# Patient Record
Sex: Male | Born: 1935 | Race: White | Hispanic: No | Marital: Married
Health system: Southern US, Community
[De-identification: ages and names within clinical notes are randomized; demographics above are authoritative.]

---

## 2004-08-03 ENCOUNTER — Ambulatory Visit: Payer: Self-pay | Admitting: Urology

## 2004-08-03 ENCOUNTER — Other Ambulatory Visit: Payer: Self-pay

## 2004-08-06 ENCOUNTER — Ambulatory Visit: Payer: Self-pay | Admitting: Urology

## 2006-10-01 ENCOUNTER — Ambulatory Visit: Payer: Self-pay | Admitting: Rheumatology

## 2008-01-19 ENCOUNTER — Ambulatory Visit: Payer: Self-pay | Admitting: Rheumatology

## 2008-02-02 ENCOUNTER — Ambulatory Visit: Payer: Self-pay | Admitting: Rheumatology

## 2008-03-30 ENCOUNTER — Other Ambulatory Visit: Payer: Self-pay

## 2008-03-30 ENCOUNTER — Ambulatory Visit: Payer: Self-pay | Admitting: Unknown Physician Specialty

## 2008-04-06 ENCOUNTER — Ambulatory Visit: Payer: Self-pay | Admitting: Unknown Physician Specialty

## 2008-08-24 ENCOUNTER — Ambulatory Visit: Payer: Self-pay | Admitting: Family Medicine

## 2008-09-05 ENCOUNTER — Ambulatory Visit: Payer: Self-pay | Admitting: Ophthalmology

## 2008-11-24 ENCOUNTER — Ambulatory Visit: Payer: Self-pay | Admitting: Urology

## 2008-12-05 ENCOUNTER — Ambulatory Visit: Payer: Self-pay | Admitting: Urology

## 2009-11-20 ENCOUNTER — Ambulatory Visit: Payer: Self-pay | Admitting: Internal Medicine

## 2010-08-18 ENCOUNTER — Emergency Department: Payer: Self-pay | Admitting: Emergency Medicine

## 2011-10-26 ENCOUNTER — Inpatient Hospital Stay: Payer: Self-pay | Admitting: Specialist

## 2011-10-26 LAB — CBC WITH DIFFERENTIAL/PLATELET
Basophil #: 0 10*3/uL (ref 0.0–0.1)
Basophil %: 0 %
HCT: 26.1 % — ABNORMAL LOW (ref 40.0–52.0)
HGB: 9 g/dL — ABNORMAL LOW (ref 13.0–18.0)
Lymphocyte #: 0.9 10*3/uL — ABNORMAL LOW (ref 1.0–3.6)
Lymphocyte %: 6.1 %
MCH: 32.7 pg (ref 26.0–34.0)
MCHC: 34.3 g/dL (ref 32.0–36.0)
MCV: 95 fL (ref 80–100)
Monocyte #: 1.4 x10 3/mm — ABNORMAL HIGH (ref 0.2–1.0)
Neutrophil #: 12.6 10*3/uL — ABNORMAL HIGH (ref 1.4–6.5)
Neutrophil %: 84.4 %
RBC: 2.74 10*6/uL — ABNORMAL LOW (ref 4.40–5.90)
RDW: 11.9 % (ref 11.5–14.5)
WBC: 14.9 10*3/uL — ABNORMAL HIGH (ref 3.8–10.6)

## 2011-10-26 LAB — CBC
HCT: 30.7 % — ABNORMAL LOW (ref 40.0–52.0)
HGB: 10.6 g/dL — ABNORMAL LOW (ref 13.0–18.0)
MCH: 32.5 pg (ref 26.0–34.0)
MCHC: 34.6 g/dL (ref 32.0–36.0)
MCV: 94 fL (ref 80–100)
Platelet: 254 10*3/uL (ref 150–440)
RBC: 3.27 10*6/uL — ABNORMAL LOW (ref 4.40–5.90)
RDW: 11.8 % (ref 11.5–14.5)
WBC: 8.2 10*3/uL (ref 3.8–10.6)

## 2011-10-26 LAB — BASIC METABOLIC PANEL
Anion Gap: 8 (ref 7–16)
Anion Gap: 9 (ref 7–16)
BUN: 30 mg/dL — ABNORMAL HIGH (ref 7–18)
BUN: 31 mg/dL — ABNORMAL HIGH (ref 7–18)
Calcium, Total: 8.4 mg/dL — ABNORMAL LOW (ref 8.5–10.1)
Calcium, Total: 7.7 mg/dL — ABNORMAL LOW (ref 8.5–10.1)
Chloride: 97 mmol/L — ABNORMAL LOW (ref 98–107)
Chloride: 99 mmol/L (ref 98–107)
Co2: 22 mmol/L (ref 21–32)
Creatinine: 1.9 mg/dL — ABNORMAL HIGH (ref 0.60–1.30)
Creatinine: 1.96 mg/dL — ABNORMAL HIGH (ref 0.60–1.30)
EGFR (African American): 37 — ABNORMAL LOW
EGFR (African American): 36 — ABNORMAL LOW
EGFR (Non-African Amer.): 32 — ABNORMAL LOW
EGFR (Non-African Amer.): 31 — ABNORMAL LOW
Glucose: 104 mg/dL — ABNORMAL HIGH (ref 65–99)
Glucose: 166 mg/dL — ABNORMAL HIGH (ref 65–99)
Osmolality: 271 (ref 275–301)
Sodium: 130 mmol/L — ABNORMAL LOW (ref 136–145)

## 2011-10-26 LAB — URINALYSIS, COMPLETE
Bilirubin,UR: NEGATIVE
Blood: NEGATIVE
Ketone: NEGATIVE
Leukocyte Esterase: NEGATIVE
Ph: 6 (ref 4.5–8.0)
RBC,UR: <1 /HPF (ref 0–5)
Squamous Epithelial: 1
WBC UR: <1 /HPF (ref 0–5)

## 2011-10-26 LAB — PROTIME-INR: Prothrombin Time: 13.1 secs (ref 11.5–14.7)

## 2011-10-27 LAB — CBC WITH DIFFERENTIAL/PLATELET
Basophil #: 0 10*3/uL (ref 0.0–0.1)
Basophil %: 0.2 %
Eosinophil #: 0 10*3/uL (ref 0.0–0.7)
Eosinophil %: 0.2 %
HCT: 20.1 % — ABNORMAL LOW (ref 40.0–52.0)
HGB: 7 g/dL — ABNORMAL LOW (ref 13.0–18.0)
Lymphocyte #: 1.3 10*3/uL (ref 1.0–3.6)
Lymphocyte %: 15.4 %
MCHC: 35 g/dL (ref 32.0–36.0)
MCV: 94 fL (ref 80–100)
Monocyte #: 1 x10 3/mm (ref 0.2–1.0)
Monocyte %: 11.8 %
Platelet: 170 10*3/uL (ref 150–440)
RBC: 2.14 10*6/uL — ABNORMAL LOW (ref 4.40–5.90)
RDW: 12.1 % (ref 11.5–14.5)

## 2011-10-27 LAB — BASIC METABOLIC PANEL
BUN: 39 mg/dL — ABNORMAL HIGH (ref 7–18)
Calcium, Total: 7.3 mg/dL — ABNORMAL LOW (ref 8.5–10.1)
Co2: 21 mmol/L (ref 21–32)
Creatinine: 2.16 mg/dL — ABNORMAL HIGH (ref 0.60–1.30)
EGFR (Non-African Amer.): 28 — ABNORMAL LOW
Glucose: 127 mg/dL — ABNORMAL HIGH (ref 65–99)
Osmolality: 277 (ref 275–301)
Sodium: 133 mmol/L — ABNORMAL LOW (ref 136–145)

## 2011-10-28 LAB — BASIC METABOLIC PANEL
BUN: 32 mg/dL — ABNORMAL HIGH (ref 7–18)
Chloride: 103 mmol/L (ref 98–107)
Co2: 21 mmol/L (ref 21–32)
EGFR (African American): 40 — ABNORMAL LOW
EGFR (Non-African Amer.): 35 — ABNORMAL LOW
Osmolality: 271 (ref 275–301)

## 2011-10-28 LAB — HEMOGLOBIN: HGB: 8.7 g/dL — ABNORMAL LOW (ref 13.0–18.0)

## 2011-10-29 ENCOUNTER — Encounter: Payer: Self-pay | Admitting: Internal Medicine

## 2011-10-29 LAB — BASIC METABOLIC PANEL
BUN: 34 mg/dL — ABNORMAL HIGH (ref 7–18)
Co2: 19 mmol/L — ABNORMAL LOW (ref 21–32)
Creatinine: 1.98 mg/dL — ABNORMAL HIGH (ref 0.60–1.30)
EGFR (Non-African Amer.): 31 — ABNORMAL LOW
Glucose: 102 mg/dL — ABNORMAL HIGH (ref 65–99)

## 2011-10-30 ENCOUNTER — Encounter: Payer: Self-pay | Admitting: Internal Medicine

## 2011-11-05 LAB — CBC WITH DIFFERENTIAL/PLATELET
Basophil %: 0.5 %
Eosinophil #: 0.2 10*3/uL (ref 0.0–0.7)
Lymphocyte %: 16.2 %
Monocyte %: 8.5 %
Neutrophil #: 7.8 10*3/uL — ABNORMAL HIGH (ref 1.4–6.5)
Neutrophil %: 72.8 %
Platelet: 416 10*3/uL (ref 150–440)
RBC: 2.56 10*6/uL — ABNORMAL LOW (ref 4.40–5.90)
RDW: 13.1 % (ref 11.5–14.5)
WBC: 10.7 10*3/uL — ABNORMAL HIGH (ref 3.8–10.6)

## 2011-11-05 LAB — URINALYSIS, COMPLETE
Bilirubin,UR: NEGATIVE
Blood: NEGATIVE
Glucose,UR: NEGATIVE mg/dL (ref 0–75)
Leukocyte Esterase: NEGATIVE
Ph: 5 (ref 4.5–8.0)
Squamous Epithelial: <1
WBC UR: 1 /HPF (ref 0–5)

## 2011-11-05 LAB — RENAL FUNCTION PANEL
BUN: 54 mg/dL — ABNORMAL HIGH (ref 7–18)
Calcium, Total: 7.9 mg/dL — ABNORMAL LOW (ref 8.5–10.1)
Chloride: 102 mmol/L (ref 98–107)
Co2: 22 mmol/L (ref 21–32)
Creatinine: 2.3 mg/dL — ABNORMAL HIGH (ref 0.60–1.30)
EGFR (African American): 30 — ABNORMAL LOW
EGFR (Non-African Amer.): 25 — ABNORMAL LOW
Glucose: 97 mg/dL (ref 65–99)
Phosphorus: 4.7 mg/dL (ref 2.5–4.9)
Potassium: 4.8 mmol/L (ref 3.5–5.1)

## 2011-11-14 ENCOUNTER — Ambulatory Visit: Payer: Self-pay | Admitting: Internal Medicine

## 2011-11-14 LAB — CBC WITH DIFFERENTIAL/PLATELET
Basophil %: 0.6 %
Eosinophil #: 0.1 10*3/uL (ref 0.0–0.7)
Eosinophil %: 1.7 %
HGB: 8.8 g/dL — ABNORMAL LOW (ref 13.0–18.0)
MCH: 31.9 pg (ref 26.0–34.0)
MCV: 94 fL (ref 80–100)
Monocyte #: 0.8 x10 3/mm (ref 0.2–1.0)
Monocyte %: 11.2 %
Neutrophil #: 4.6 10*3/uL (ref 1.4–6.5)
Neutrophil %: 66.3 %
Platelet: 448 10*3/uL — ABNORMAL HIGH (ref 150–440)
RDW: 14.2 % (ref 11.5–14.5)

## 2011-11-14 LAB — RENAL FUNCTION PANEL
Anion Gap: 11 (ref 7–16)
BUN: 39 mg/dL — ABNORMAL HIGH (ref 7–18)
Calcium, Total: 8 mg/dL — ABNORMAL LOW (ref 8.5–10.1)
Co2: 22 mmol/L (ref 21–32)
Creatinine: 1.94 mg/dL — ABNORMAL HIGH (ref 0.60–1.30)
EGFR (African American): 36 — ABNORMAL LOW
Glucose: 90 mg/dL (ref 65–99)
Phosphorus: 3.6 mg/dL (ref 2.5–4.9)
Potassium: 4.1 mmol/L (ref 3.5–5.1)
Sodium: 137 mmol/L (ref 136–145)

## 2011-11-28 LAB — RENAL FUNCTION PANEL
Albumin: 3.1 g/dL — ABNORMAL LOW (ref 3.4–5.0)
Anion Gap: 10 (ref 7–16)
BUN: 45 mg/dL — ABNORMAL HIGH (ref 7–18)
Chloride: 102 mmol/L (ref 98–107)
Co2: 23 mmol/L (ref 21–32)
EGFR (Non-African Amer.): 27 — ABNORMAL LOW
Glucose: 83 mg/dL (ref 65–99)
Sodium: 135 mmol/L — ABNORMAL LOW (ref 136–145)

## 2011-11-30 ENCOUNTER — Encounter: Payer: Self-pay | Admitting: Internal Medicine

## 2012-08-06 ENCOUNTER — Ambulatory Visit: Payer: Self-pay | Admitting: Family Medicine

## 2012-08-24 ENCOUNTER — Ambulatory Visit: Payer: Self-pay | Admitting: Nephrology

## 2012-08-24 LAB — CBC WITH DIFFERENTIAL/PLATELET
Basophil #: 0 10*3/uL (ref 0.0–0.1)
Basophil %: 0.5 %
Eosinophil #: 0.2 10*3/uL (ref 0.0–0.7)
HCT: 31.6 % — ABNORMAL LOW (ref 40.0–52.0)
HGB: 10.6 g/dL — ABNORMAL LOW (ref 13.0–18.0)
Lymphocyte #: 1.5 10*3/uL (ref 1.0–3.6)
MCH: 31.8 pg (ref 26.0–34.0)
MCHC: 33.5 g/dL (ref 32.0–36.0)
MCV: 95 fL (ref 80–100)
Monocyte #: 0.7 x10 3/mm (ref 0.2–1.0)
Neutrophil #: 4.1 10*3/uL (ref 1.4–6.5)
Platelet: 270 10*3/uL (ref 150–440)
RBC: 3.33 10*6/uL — ABNORMAL LOW (ref 4.40–5.90)
RDW: 12.3 % (ref 11.5–14.5)
WBC: 6.6 10*3/uL (ref 3.8–10.6)

## 2012-08-24 LAB — URINALYSIS, COMPLETE
Bacteria: NONE SEEN
Blood: NEGATIVE
Hyaline Cast: 1
Nitrite: NEGATIVE
Ph: 6 (ref 4.5–8.0)
Protein: >=500
RBC,UR: 1 /HPF (ref 0–5)
Specific Gravity: 1.015 (ref 1.003–1.030)
Squamous Epithelial: 2
WBC UR: 4 /HPF (ref 0–5)

## 2012-08-24 LAB — COMPREHENSIVE METABOLIC PANEL
Alkaline Phosphatase: 118 U/L (ref 50–136)
BUN: 34 mg/dL — ABNORMAL HIGH (ref 7–18)
Bilirubin,Total: 0.3 mg/dL (ref 0.2–1.0)
Chloride: 99 mmol/L (ref 98–107)
EGFR (African American): 31 — ABNORMAL LOW
EGFR (Non-African Amer.): 27 — ABNORMAL LOW
Osmolality: 272 (ref 275–301)
Potassium: 4.5 mmol/L (ref 3.5–5.1)
SGOT(AST): 30 U/L (ref 15–37)
Sodium: 132 mmol/L — ABNORMAL LOW (ref 136–145)
Total Protein: 6.8 g/dL (ref 6.4–8.2)

## 2012-08-26 ENCOUNTER — Observation Stay: Payer: Self-pay | Admitting: Nephrology

## 2012-11-05 ENCOUNTER — Ambulatory Visit: Payer: Self-pay | Admitting: Surgery

## 2012-11-12 ENCOUNTER — Ambulatory Visit: Payer: Self-pay | Admitting: Surgery

## 2012-11-17 LAB — PATHOLOGY REPORT

## 2012-12-14 ENCOUNTER — Ambulatory Visit: Payer: Self-pay | Admitting: Family Medicine

## 2012-12-29 ENCOUNTER — Ambulatory Visit: Payer: Self-pay | Admitting: Family Medicine

## 2012-12-30 ENCOUNTER — Inpatient Hospital Stay: Payer: Self-pay | Admitting: Internal Medicine

## 2012-12-30 LAB — CK TOTAL AND CKMB (NOT AT ARMC)
CK, Total: 113 U/L (ref 35–232)
CK-MB: 4 ng/mL — ABNORMAL HIGH (ref 0.5–3.6)

## 2012-12-30 LAB — CBC
HCT: 27.1 % — ABNORMAL LOW (ref 40.0–52.0)
HGB: 9.2 g/dL — ABNORMAL LOW (ref 13.0–18.0)
MCV: 94 fL (ref 80–100)
RDW: 12.1 % (ref 11.5–14.5)
WBC: 9.7 10*3/uL (ref 3.8–10.6)

## 2012-12-30 LAB — URINALYSIS, COMPLETE
Bilirubin,UR: NEGATIVE
Hyaline Cast: 3
Nitrite: NEGATIVE
Protein: >=500
RBC,UR: 1 /HPF (ref 0–5)
Squamous Epithelial: 1

## 2012-12-30 LAB — COMPREHENSIVE METABOLIC PANEL
Albumin: 2.9 g/dL — ABNORMAL LOW (ref 3.4–5.0)
Alkaline Phosphatase: 75 U/L (ref 50–136)
Bilirubin,Total: 0.3 mg/dL (ref 0.2–1.0)
Co2: 21 mmol/L (ref 21–32)
EGFR (African American): 30 — ABNORMAL LOW
Potassium: 4.5 mmol/L (ref 3.5–5.1)
SGPT (ALT): 10 U/L — ABNORMAL LOW (ref 12–78)
Sodium: 121 mmol/L — ABNORMAL LOW (ref 136–145)
Total Protein: 6.3 g/dL — ABNORMAL LOW (ref 6.4–8.2)

## 2012-12-31 LAB — BASIC METABOLIC PANEL
Anion Gap: 12 (ref 7–16)
BUN: 42 mg/dL — ABNORMAL HIGH (ref 7–18)
Calcium, Total: 8 mg/dL — ABNORMAL LOW (ref 8.5–10.1)
Chloride: 89 mmol/L — ABNORMAL LOW (ref 98–107)
Co2: 23 mmol/L (ref 21–32)
Creatinine: 2.11 mg/dL — ABNORMAL HIGH (ref 0.60–1.30)
EGFR (Non-African Amer.): 28 — ABNORMAL LOW
Glucose: 141 mg/dL — ABNORMAL HIGH (ref 65–99)
Osmolality: 262 (ref 275–301)
Potassium: 4.3 mmol/L (ref 3.5–5.1)

## 2012-12-31 LAB — TROPONIN I: Troponin-I: 0.07 ng/mL — ABNORMAL HIGH

## 2013-01-01 LAB — CBC WITH DIFFERENTIAL/PLATELET
Basophil #: 0 10*3/uL (ref 0.0–0.1)
Eosinophil %: 1.8 %
HCT: 26.1 % — ABNORMAL LOW (ref 40.0–52.0)
Lymphocyte %: 12.6 %
MCH: 33.1 pg (ref 26.0–34.0)
MCHC: 35.6 g/dL (ref 32.0–36.0)
MCV: 93 fL (ref 80–100)
Monocyte #: 1.6 x10 3/mm — ABNORMAL HIGH (ref 0.2–1.0)
Monocyte %: 14.4 %
Neutrophil #: 8.1 10*3/uL — ABNORMAL HIGH (ref 1.4–6.5)
Neutrophil %: 71 %
Platelet: 241 10*3/uL (ref 150–440)
RDW: 12.4 % (ref 11.5–14.5)
WBC: 11.3 10*3/uL — ABNORMAL HIGH (ref 3.8–10.6)

## 2013-01-01 LAB — BASIC METABOLIC PANEL
Anion Gap: 6 — ABNORMAL LOW (ref 7–16)
BUN: 51 mg/dL — ABNORMAL HIGH (ref 7–18)
Calcium, Total: 7.9 mg/dL — ABNORMAL LOW (ref 8.5–10.1)
Chloride: 87 mmol/L — ABNORMAL LOW (ref 98–107)
Co2: 29 mmol/L (ref 21–32)
Creatinine: 2.4 mg/dL — ABNORMAL HIGH (ref 0.60–1.30)
EGFR (Non-African Amer.): 24 — ABNORMAL LOW
Osmolality: 259 (ref 275–301)
Potassium: 3.4 mmol/L — ABNORMAL LOW (ref 3.5–5.1)

## 2013-01-01 LAB — TSH: Thyroid Stimulating Horm: 1.89 u[IU]/mL

## 2013-01-02 LAB — RENAL FUNCTION PANEL
Anion Gap: 9 (ref 7–16)
BUN: 57 mg/dL — ABNORMAL HIGH (ref 7–18)
Calcium, Total: 8.2 mg/dL — ABNORMAL LOW (ref 8.5–10.1)
Chloride: 81 mmol/L — ABNORMAL LOW (ref 98–107)
Co2: 31 mmol/L (ref 21–32)
Creatinine: 2.25 mg/dL — ABNORMAL HIGH (ref 0.60–1.30)
EGFR (African American): 30 — ABNORMAL LOW
EGFR (Non-African Amer.): 26 — ABNORMAL LOW
Glucose: 96 mg/dL (ref 65–99)
Osmolality: 260 (ref 275–301)
Phosphorus: 3.9 mg/dL (ref 2.5–4.9)
Potassium: 3.3 mmol/L — ABNORMAL LOW (ref 3.5–5.1)
Sodium: 121 mmol/L — ABNORMAL LOW (ref 136–145)

## 2013-01-03 LAB — BASIC METABOLIC PANEL
Anion Gap: 7 (ref 7–16)
BUN: 61 mg/dL — ABNORMAL HIGH (ref 7–18)
Creatinine: 2.36 mg/dL — ABNORMAL HIGH (ref 0.60–1.30)
EGFR (African American): 28 — ABNORMAL LOW
EGFR (Non-African Amer.): 25 — ABNORMAL LOW
Glucose: 101 mg/dL — ABNORMAL HIGH (ref 65–99)
Sodium: 127 mmol/L — ABNORMAL LOW (ref 136–145)

## 2013-04-16 ENCOUNTER — Inpatient Hospital Stay: Payer: Self-pay | Admitting: Internal Medicine

## 2013-04-16 LAB — CBC
HCT: 29.4 % — ABNORMAL LOW (ref 40.0–52.0)
HGB: 10.4 g/dL — ABNORMAL LOW (ref 13.0–18.0)
MCH: 33.2 pg (ref 26.0–34.0)
Platelet: 274 10*3/uL (ref 150–440)
RBC: 3.13 10*6/uL — ABNORMAL LOW (ref 4.40–5.90)
RDW: 12.6 % (ref 11.5–14.5)
WBC: 6.3 10*3/uL (ref 3.8–10.6)

## 2013-04-16 LAB — BASIC METABOLIC PANEL
Anion Gap: 6 — ABNORMAL LOW (ref 7–16)
BUN: 52 mg/dL — ABNORMAL HIGH (ref 7–18)
Chloride: 92 mmol/L — ABNORMAL LOW (ref 98–107)
Co2: 29 mmol/L (ref 21–32)
EGFR (African American): 24 — ABNORMAL LOW
Glucose: 104 mg/dL — ABNORMAL HIGH (ref 65–99)
Osmolality: 270 (ref 275–301)
Potassium: 4.1 mmol/L (ref 3.5–5.1)

## 2013-04-16 LAB — URINALYSIS, COMPLETE
Blood: NEGATIVE
Leukocyte Esterase: NEGATIVE
Nitrite: NEGATIVE
Protein: 100
RBC,UR: NONE SEEN /HPF (ref 0–5)
Specific Gravity: 1.008 (ref 1.003–1.030)

## 2013-04-16 LAB — TROPONIN I: Troponin-I: 0.04 ng/mL

## 2013-04-17 LAB — BASIC METABOLIC PANEL
BUN: 54 mg/dL — ABNORMAL HIGH (ref 7–18)
Creatinine: 2.52 mg/dL — ABNORMAL HIGH (ref 0.60–1.30)
EGFR (African American): 26 — ABNORMAL LOW
Potassium: 4.2 mmol/L (ref 3.5–5.1)
Sodium: 132 mmol/L — ABNORMAL LOW (ref 136–145)

## 2013-04-18 LAB — COMPREHENSIVE METABOLIC PANEL
Albumin: 2.8 g/dL — ABNORMAL LOW (ref 3.4–5.0)
Anion Gap: 9 (ref 7–16)
BUN: 51 mg/dL — ABNORMAL HIGH (ref 7–18)
Calcium, Total: 8.2 mg/dL — ABNORMAL LOW (ref 8.5–10.1)
Chloride: 101 mmol/L (ref 98–107)
Co2: 24 mmol/L (ref 21–32)
Creatinine: 2.38 mg/dL — ABNORMAL HIGH (ref 0.60–1.30)
EGFR (African American): 28 — ABNORMAL LOW
Glucose: 106 mg/dL — ABNORMAL HIGH (ref 65–99)
Potassium: 3.8 mmol/L (ref 3.5–5.1)
SGOT(AST): 35 U/L (ref 15–37)
SGPT (ALT): 27 U/L (ref 12–78)
Total Protein: 5.6 g/dL — ABNORMAL LOW (ref 6.4–8.2)

## 2013-04-18 LAB — HEMOGLOBIN: HGB: 8.4 g/dL — ABNORMAL LOW (ref 13.0–18.0)

## 2013-04-30 ENCOUNTER — Ambulatory Visit: Payer: Self-pay | Admitting: Nephrology

## 2013-05-03 ENCOUNTER — Inpatient Hospital Stay: Payer: Self-pay | Admitting: Internal Medicine

## 2013-05-03 LAB — PRO B NATRIURETIC PEPTIDE: B-Type Natriuretic Peptide: 59578 pg/mL — ABNORMAL HIGH (ref 0–450)

## 2013-05-03 LAB — CBC
HGB: 9.5 g/dL — ABNORMAL LOW (ref 13.0–18.0)
MCH: 33.7 pg (ref 26.0–34.0)
MCHC: 35.7 g/dL (ref 32.0–36.0)
Platelet: 276 10*3/uL (ref 150–440)
RBC: 2.82 10*6/uL — ABNORMAL LOW (ref 4.40–5.90)
RDW: 12.5 % (ref 11.5–14.5)

## 2013-05-03 LAB — URINALYSIS, COMPLETE
Blood: NEGATIVE
Hyaline Cast: 2
Ketone: NEGATIVE
Leukocyte Esterase: NEGATIVE
Nitrite: NEGATIVE
Ph: 6 (ref 4.5–8.0)
Protein: 100
WBC UR: <1 /HPF (ref 0–5)

## 2013-05-03 LAB — CK TOTAL AND CKMB (NOT AT ARMC)
CK, Total: 69 U/L (ref 35–232)
CK-MB: 2 ng/mL (ref 0.5–3.6)
CK-MB: 2 ng/mL (ref 0.5–3.6)

## 2013-05-03 LAB — TROPONIN I
Troponin-I: 0.32 ng/mL — ABNORMAL HIGH
Troponin-I: 0.26 ng/mL — ABNORMAL HIGH

## 2013-05-03 LAB — BASIC METABOLIC PANEL
Calcium, Total: 9.1 mg/dL (ref 8.5–10.1)
Chloride: 88 mmol/L — ABNORMAL LOW (ref 98–107)
Co2: 30 mmol/L (ref 21–32)
EGFR (Non-African Amer.): 20 — ABNORMAL LOW
Potassium: 3.9 mmol/L (ref 3.5–5.1)

## 2013-05-05 LAB — BASIC METABOLIC PANEL
Anion Gap: 9 (ref 7–16)
BUN: 68 mg/dL — ABNORMAL HIGH (ref 7–18)
Calcium, Total: 8.7 mg/dL (ref 8.5–10.1)
EGFR (African American): 24 — ABNORMAL LOW
Glucose: 165 mg/dL — ABNORMAL HIGH (ref 65–99)
Sodium: 127 mmol/L — ABNORMAL LOW (ref 136–145)

## 2013-05-06 LAB — RENAL FUNCTION PANEL
Albumin: 3.4 g/dL (ref 3.4–5.0)
Anion Gap: 9 (ref 7–16)
Chloride: 91 mmol/L — ABNORMAL LOW (ref 98–107)
Co2: 28 mmol/L (ref 21–32)
Glucose: 152 mg/dL — ABNORMAL HIGH (ref 65–99)
Phosphorus: 5.1 mg/dL — ABNORMAL HIGH (ref 2.5–4.9)
Potassium: 3.1 mmol/L — ABNORMAL LOW (ref 3.5–5.1)
Sodium: 128 mmol/L — ABNORMAL LOW (ref 136–145)

## 2013-05-07 LAB — COMPREHENSIVE METABOLIC PANEL
Alkaline Phosphatase: 71 U/L (ref 50–136)
Anion Gap: 9 (ref 7–16)
BUN: 82 mg/dL — ABNORMAL HIGH (ref 7–18)
Calcium, Total: 8.5 mg/dL (ref 8.5–10.1)
Chloride: 95 mmol/L — ABNORMAL LOW (ref 98–107)
Co2: 27 mmol/L (ref 21–32)
Creatinine: 2.69 mg/dL — ABNORMAL HIGH (ref 0.60–1.30)
EGFR (African American): 24 — ABNORMAL LOW
EGFR (Non-African Amer.): 21 — ABNORMAL LOW
Glucose: 146 mg/dL — ABNORMAL HIGH (ref 65–99)
Potassium: 3.9 mmol/L (ref 3.5–5.1)
SGPT (ALT): 66 U/L (ref 12–78)
Sodium: 131 mmol/L — ABNORMAL LOW (ref 136–145)
Total Protein: 6.2 g/dL — ABNORMAL LOW (ref 6.4–8.2)

## 2013-05-08 LAB — CULTURE, BLOOD (SINGLE)

## 2013-05-09 LAB — CBC WITH DIFFERENTIAL/PLATELET
Basophil #: 0 10*3/uL (ref 0.0–0.1)
Eosinophil #: 0 10*3/uL (ref 0.0–0.7)
Eosinophil %: 0 %
HCT: 28 % — ABNORMAL LOW (ref 40.0–52.0)
HGB: 9.8 g/dL — ABNORMAL LOW (ref 13.0–18.0)
Lymphocyte #: 1 10*3/uL (ref 1.0–3.6)
Lymphocyte %: 7.4 %
MCH: 34.1 pg — ABNORMAL HIGH (ref 26.0–34.0)
MCHC: 35.1 g/dL (ref 32.0–36.0)
MCV: 97 fL (ref 80–100)
Monocyte #: 1.3 x10 3/mm — ABNORMAL HIGH (ref 0.2–1.0)
Neutrophil #: 11.4 10*3/uL — ABNORMAL HIGH (ref 1.4–6.5)
Platelet: 277 10*3/uL (ref 150–440)
RBC: 2.88 10*6/uL — ABNORMAL LOW (ref 4.40–5.90)
RDW: 13.4 % (ref 11.5–14.5)
WBC: 13.7 10*3/uL — ABNORMAL HIGH (ref 3.8–10.6)

## 2013-05-09 LAB — BASIC METABOLIC PANEL
Anion Gap: 8 (ref 7–16)
Chloride: 94 mmol/L — ABNORMAL LOW (ref 98–107)
Creatinine: 3.01 mg/dL — ABNORMAL HIGH (ref 0.60–1.30)
EGFR (African American): 21 — ABNORMAL LOW
Glucose: 162 mg/dL — ABNORMAL HIGH (ref 65–99)
Osmolality: 306 (ref 275–301)
Potassium: 3.9 mmol/L (ref 3.5–5.1)
Sodium: 132 mmol/L — ABNORMAL LOW (ref 136–145)

## 2013-05-10 ENCOUNTER — Ambulatory Visit: Payer: Self-pay | Admitting: Internal Medicine

## 2013-05-11 LAB — BASIC METABOLIC PANEL
BUN: 127 mg/dL — ABNORMAL HIGH (ref 7–18)
Calcium, Total: 8.9 mg/dL (ref 8.5–10.1)
EGFR (African American): 21 — ABNORMAL LOW
Glucose: 177 mg/dL — ABNORMAL HIGH (ref 65–99)
Potassium: 3.6 mmol/L (ref 3.5–5.1)
Sodium: 135 mmol/L — ABNORMAL LOW (ref 136–145)

## 2013-05-31 ENCOUNTER — Ambulatory Visit: Payer: Self-pay | Admitting: Internal Medicine

## 2013-06-28 ENCOUNTER — Ambulatory Visit: Payer: Self-pay | Admitting: Family Medicine

## 2013-07-05 ENCOUNTER — Emergency Department: Payer: Self-pay | Admitting: Emergency Medicine

## 2013-07-05 LAB — COMPREHENSIVE METABOLIC PANEL
ALBUMIN: 2.9 g/dL — AB (ref 3.4–5.0)
ANION GAP: 8 (ref 7–16)
AST: 30 U/L (ref 15–37)
Alkaline Phosphatase: 75 U/L
BUN: 48 mg/dL — AB (ref 7–18)
Bilirubin,Total: 0.3 mg/dL (ref 0.2–1.0)
CHLORIDE: 87 mmol/L — AB (ref 98–107)
CREATININE: 2.4 mg/dL — AB (ref 0.60–1.30)
Calcium, Total: 8.5 mg/dL (ref 8.5–10.1)
Co2: 29 mmol/L (ref 21–32)
EGFR (Non-African Amer.): 24 — ABNORMAL LOW
GFR CALC AF AMER: 28 — AB
Glucose: 121 mg/dL — ABNORMAL HIGH (ref 65–99)
Osmolality: 264 (ref 275–301)
Potassium: 3.6 mmol/L (ref 3.5–5.1)
SGPT (ALT): 26 U/L (ref 12–78)
Sodium: 124 mmol/L — ABNORMAL LOW (ref 136–145)
Total Protein: 6.7 g/dL (ref 6.4–8.2)

## 2013-07-05 LAB — URINALYSIS, COMPLETE
Bacteria: NONE SEEN
Bilirubin,UR: NEGATIVE
Blood: NEGATIVE
Glucose,UR: NEGATIVE mg/dL (ref 0–75)
Ketone: NEGATIVE
Leukocyte Esterase: NEGATIVE
Nitrite: NEGATIVE
PH: 6 (ref 4.5–8.0)
RBC,UR: NONE SEEN /HPF (ref 0–5)
SPECIFIC GRAVITY: 1.01 (ref 1.003–1.030)
Squamous Epithelial: <1
WBC UR: 1 /HPF (ref 0–5)

## 2013-07-05 LAB — CBC
HCT: 24.2 % — ABNORMAL LOW (ref 40.0–52.0)
HGB: 8.5 g/dL — ABNORMAL LOW (ref 13.0–18.0)
MCH: 33.6 pg (ref 26.0–34.0)
MCHC: 35.2 g/dL (ref 32.0–36.0)
MCV: 95 fL (ref 80–100)
PLATELETS: 254 10*3/uL (ref 150–440)
RBC: 2.54 10*6/uL — ABNORMAL LOW (ref 4.40–5.90)
RDW: 13.3 % (ref 11.5–14.5)
WBC: 10.2 10*3/uL (ref 3.8–10.6)

## 2013-07-05 LAB — PRO B NATRIURETIC PEPTIDE: B-Type Natriuretic Peptide: 24763 pg/mL — ABNORMAL HIGH (ref 0–450)

## 2013-08-01 DEATH — deceased

## 2014-03-17 IMAGING — CR DG CHEST 2V
1 series · 2 of 2 positions shown · non-contrast
Comparison: 05/09/2013

CLINICAL DATA: Cough

EXAM:
CHEST  2 VIEW

[Series 1: pa · 0.17mm/px · 2 of 2 slices shown]
[im 1/2]
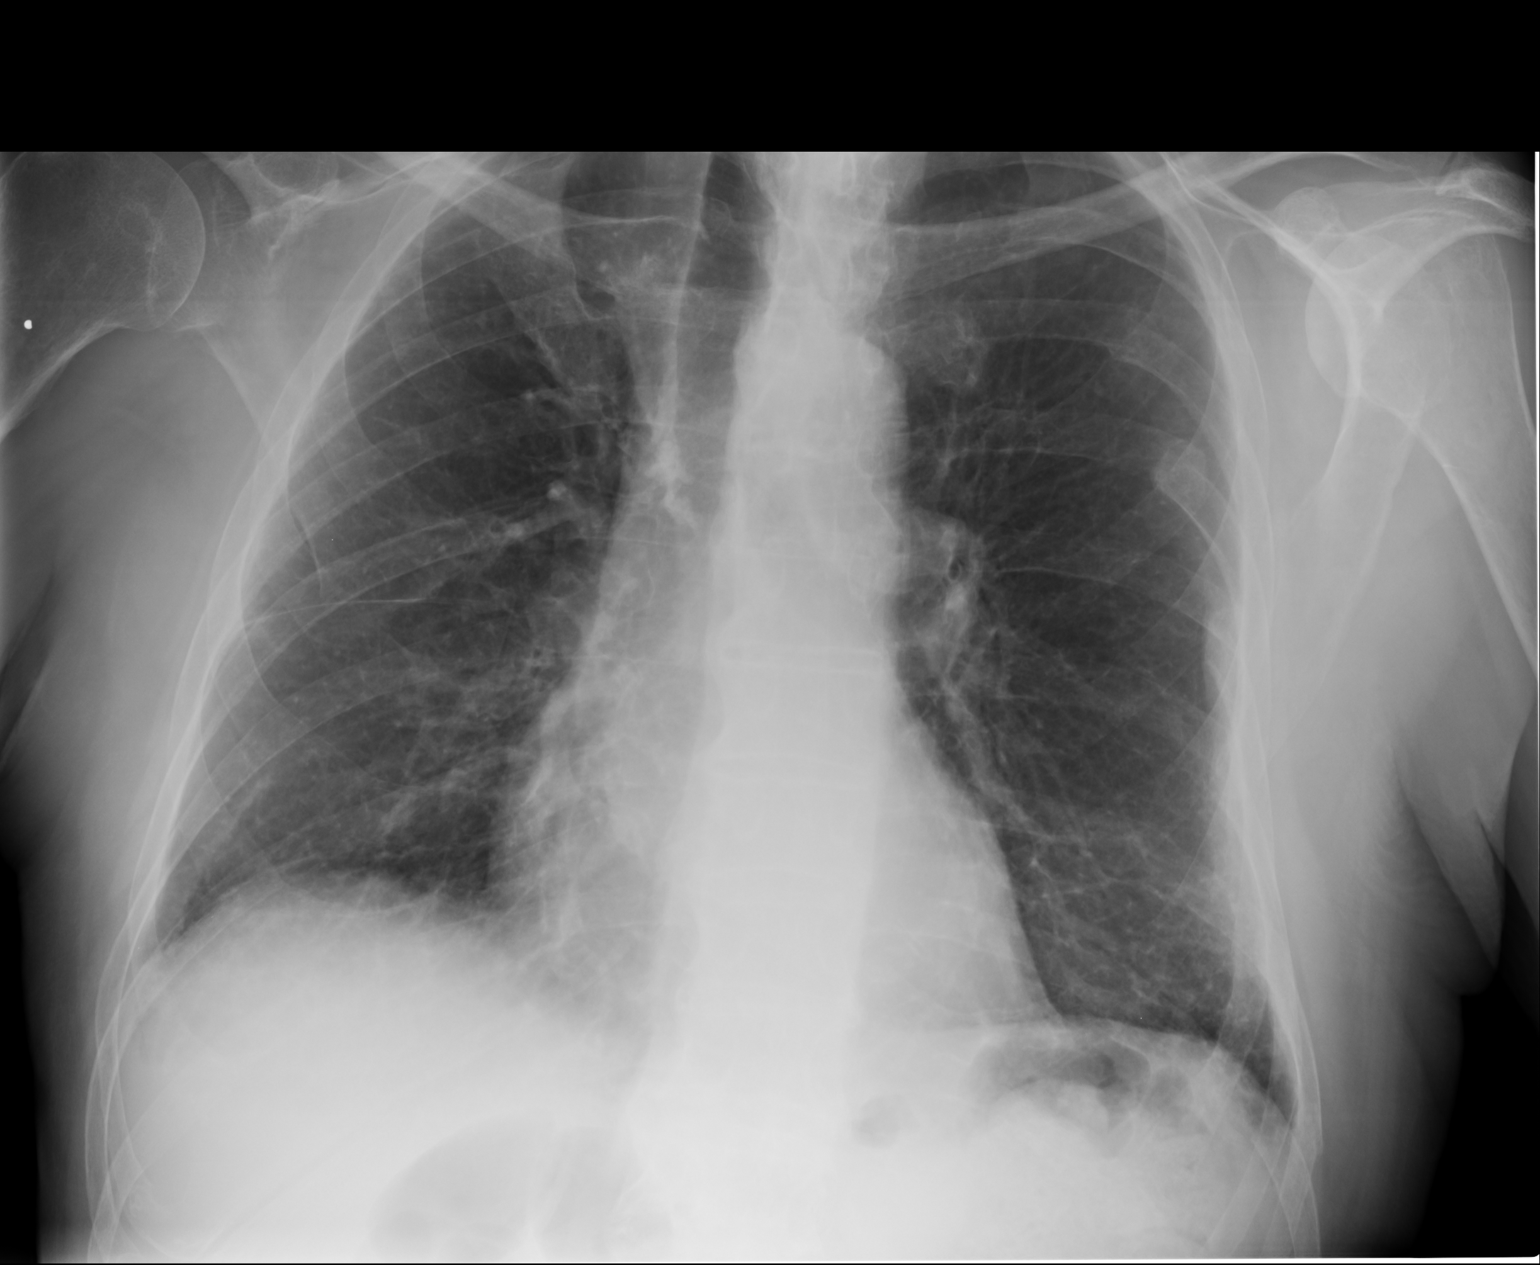
[im 2/2]
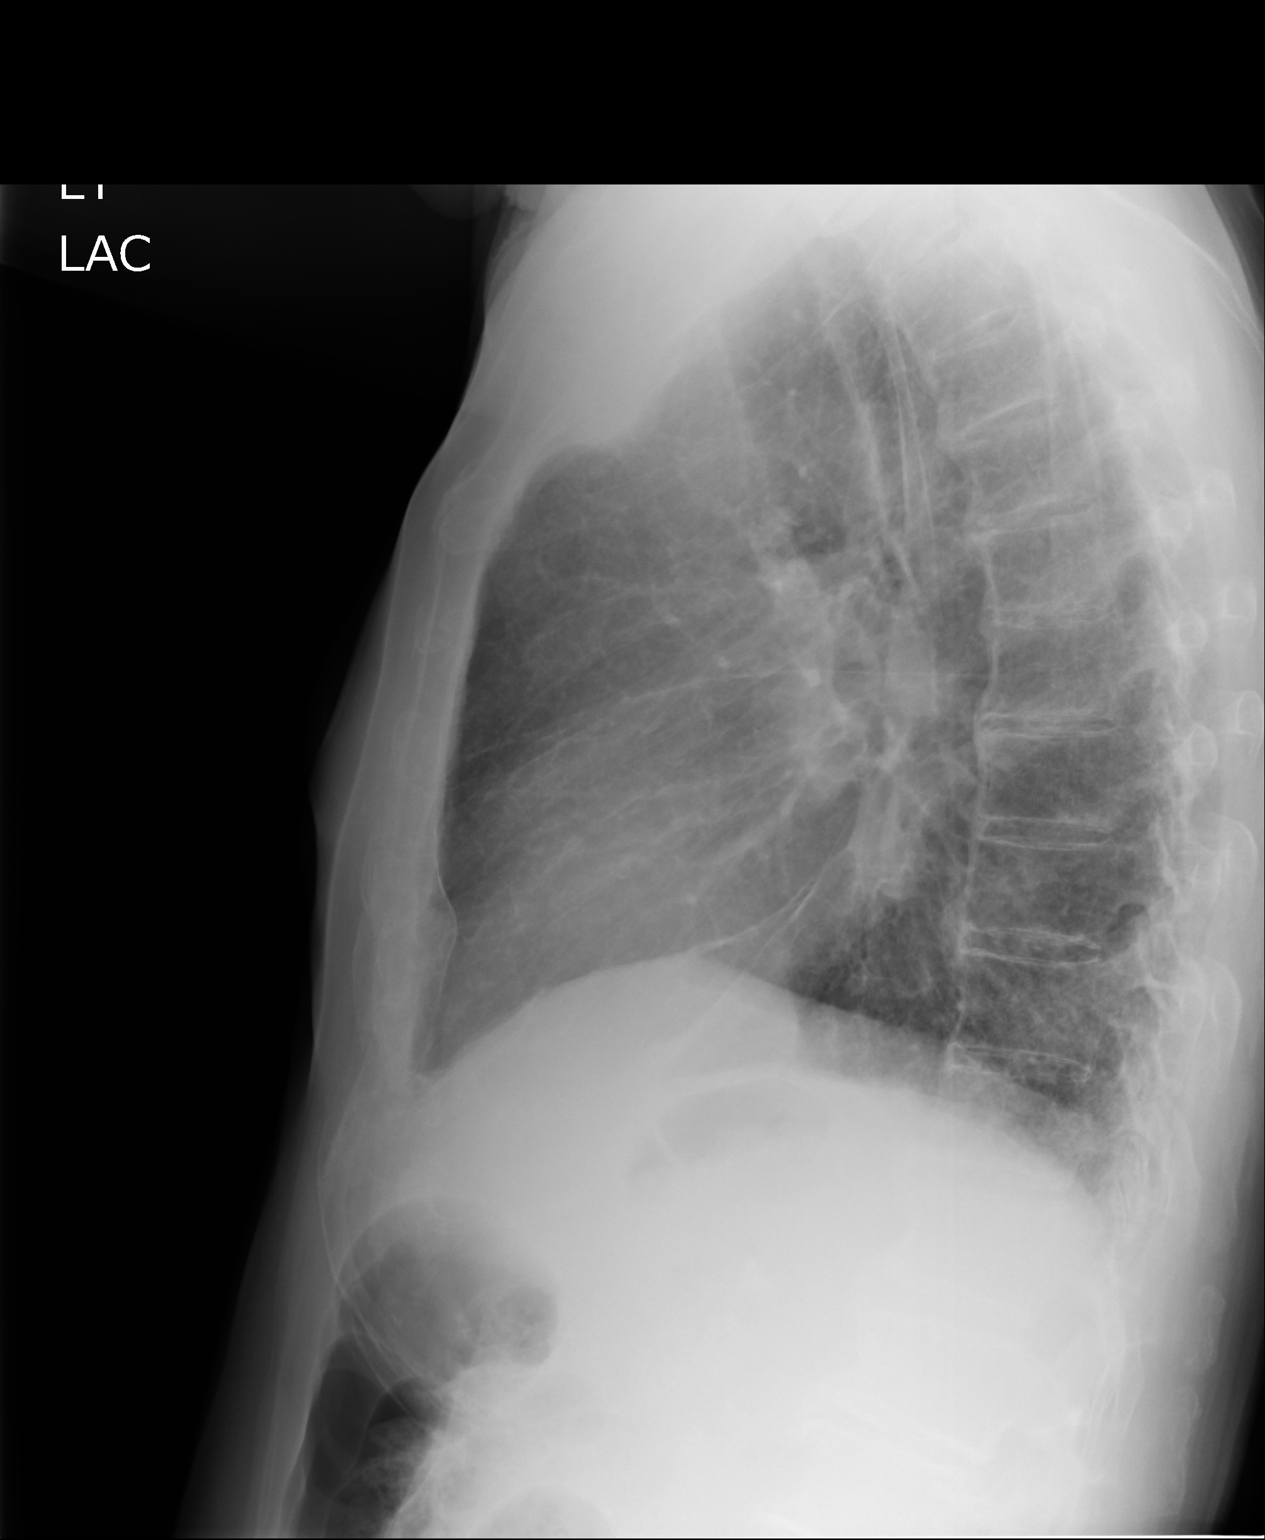

[2 of 2 positions shown; findings below may reference images not displayed]

FINDINGS: Cardiac shadow is stable. The lungs are well aerated with mild
interstitial changes. No focal infiltrate is seen. Multiple rib
fractures are again noted on the left.
IMPRESSION: No acute abnormality noted.

## 2014-03-24 IMAGING — CR DG CHEST 2V
1 series · 2 of 2 positions shown · non-contrast
Comparison: June 28, 2013 chest radiograph and chest CT
May 09, 2013

CLINICAL DATA: Cough and dyspnea

EXAM:
CHEST  2 VIEW

[Series 1: ap · 0.17mm/px · 2 of 2 slices shown]
[im 1/2]
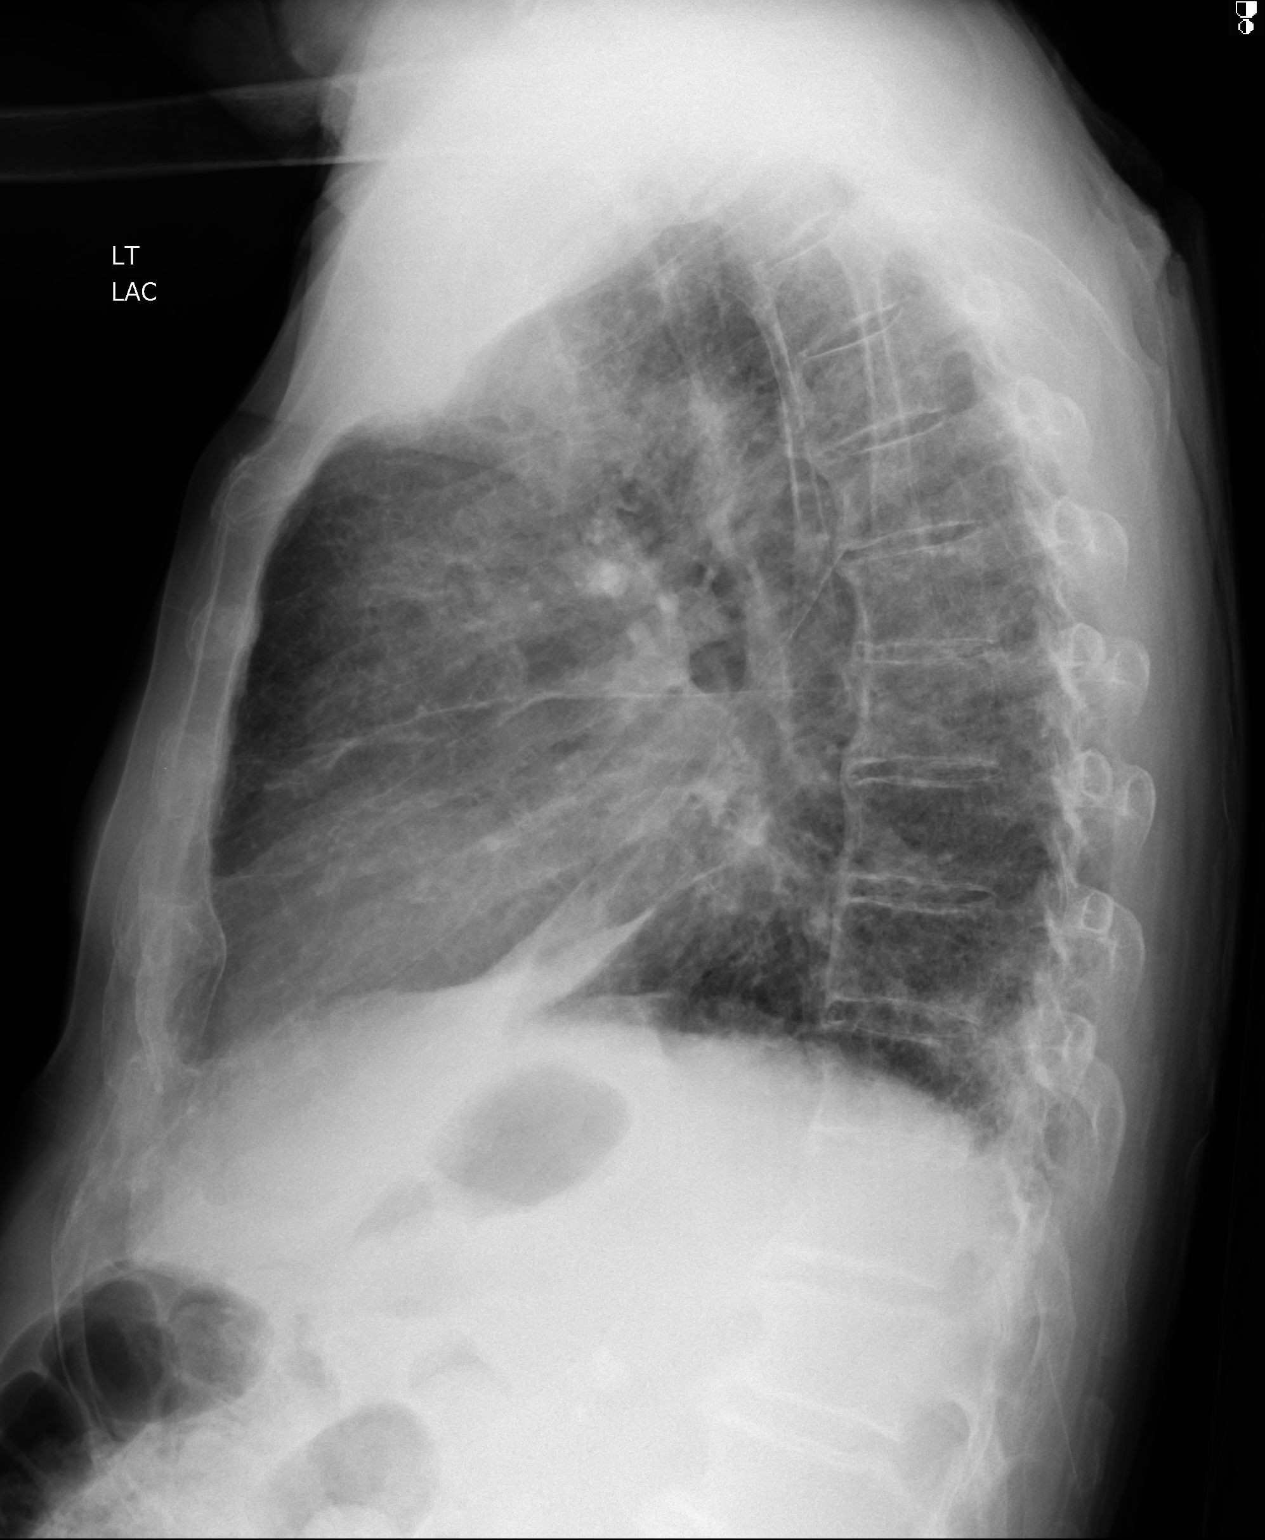
[im 2/2]
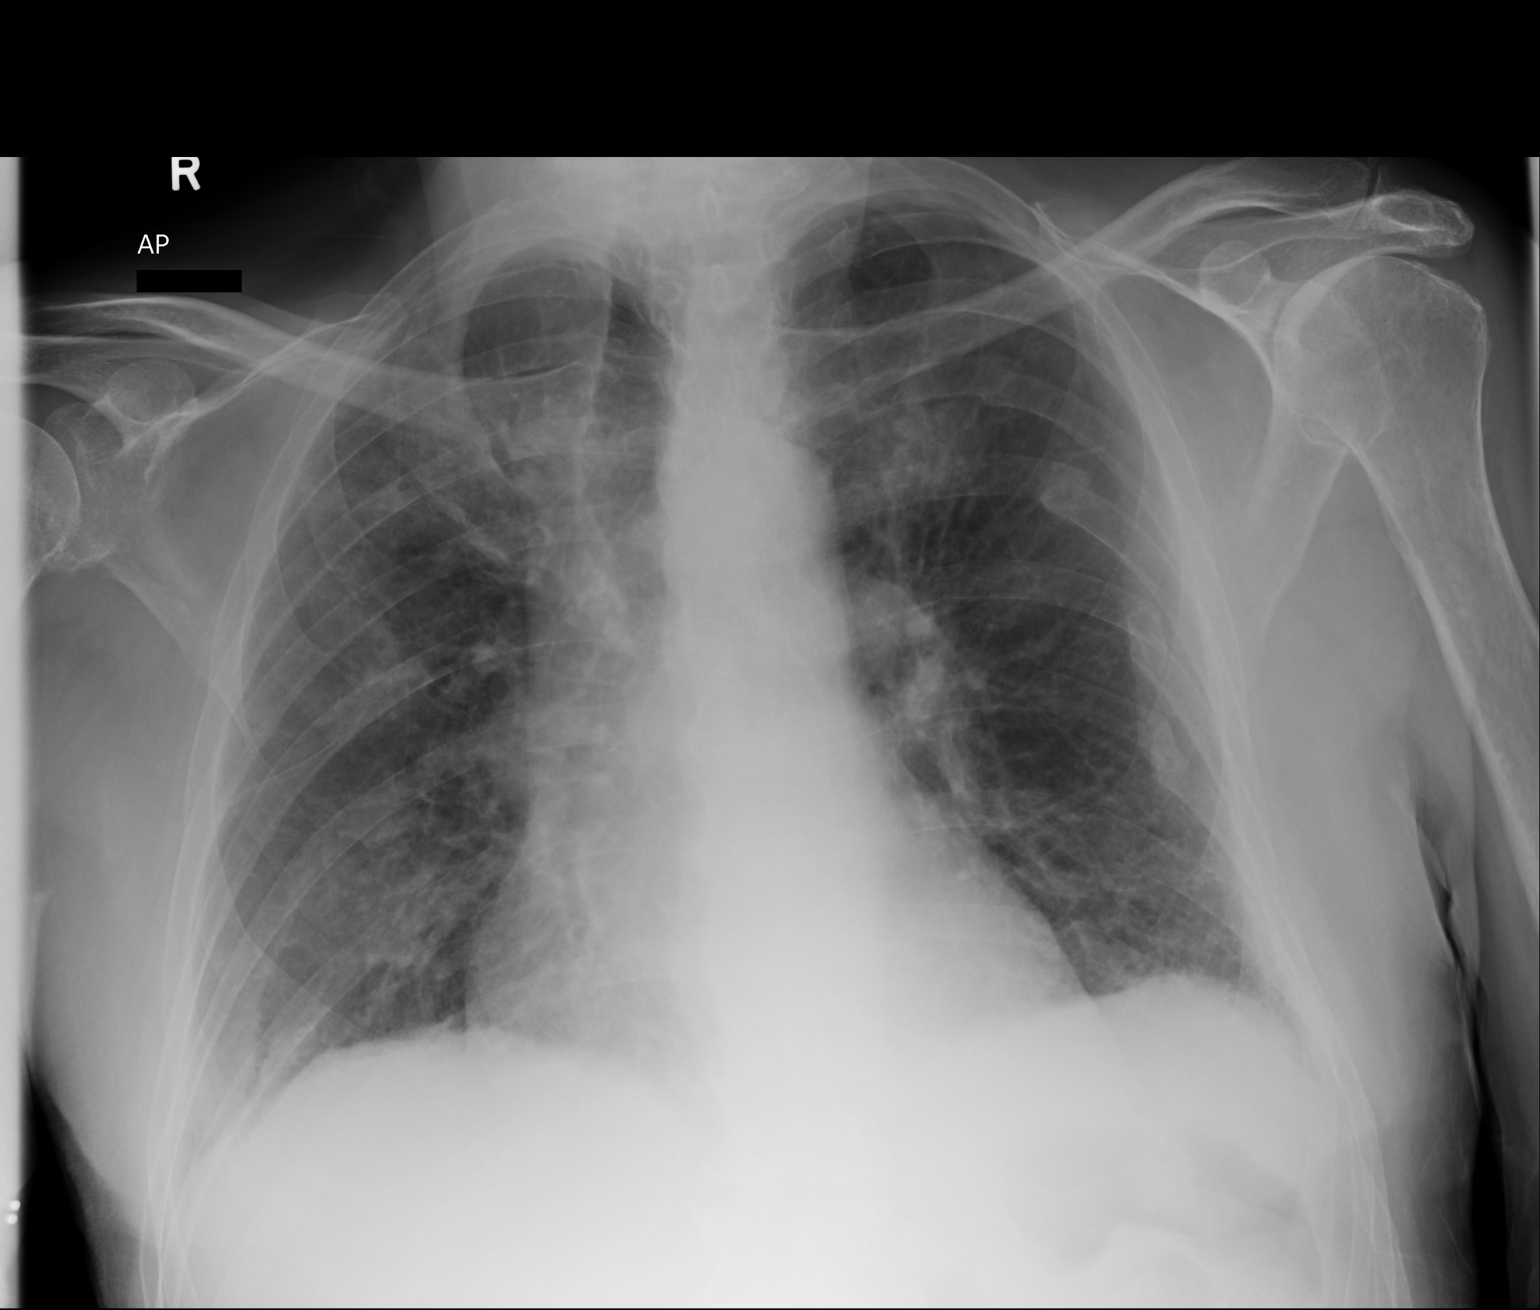

[2 of 2 positions shown; findings below may reference images not displayed]

FINDINGS: There is mild interstitial fibrosis in the but cysts, stable. There
is no frank edema or consolidation. Heart is upper normal in size
with normal pulmonary vascularity. No adenopathy. No pneumothorax.
There are healed rib fractures on the left. There is degenerative
change in the thoracic spine.
IMPRESSION: Areas of interstitial fibrosis, stable.  No edema or consolidation.

## 2014-10-21 NOTE — Discharge Summary (Signed)
PATIENT NAME:  Travis Warren, Travis Warren MR#:  [id number] DATE OF BIRTH:  [birth year]  DATE OF ADMISSION:  05/03/2013 DATE OF DISCHARGE:  05/11/2013  ADMITTING PHYSICIAN:  Susa Griffins, MD  DISCHARGING PHYSICIAN: Enid Baas M.D.   PRIMARY CARE PHYSICIAN: Demetrios Isaacs. Sherrie Mustache, MD  CONSULTATIONS:  1.  Nephrology consultation with Mosetta Pigeon.   2.  Palliative care consultation by Dr. Harvie Junior. 3.  Neurology consultation with Dr. Durene Cal. Sherryll Burger.    DISCHARGE DIAGNOSES: 1.  Acute respiratory failure.  2.  Acute on chronic diastolic congestive heart failure exacerbation.  3.  Aspiration pneumonia.  4.  Parkinson's disease.  5.  Dysphagia.  6.  Right nontraumatic contracture with tremors.  7.  Chronic kidney disease stage 4.  8.  Chronic hyponatremia.  9.  Orthostatic hypotension.  10.  History of prostate cancer.   DISCHARGE HOME MEDICATIONS:  1.  Colace 200 mg p.o. daily at bedtime.   2.  Cranberry oral capsule, 1 capsule once a day.  3.  Ranitidine 150 mg p.o. daily.  4.  Fish oil 1 gram p.o. b.i.d.  5.  Vitamin D3 1000 international units 1 capsule p.o. daily.  6.  Multivitamin 1 tablet p.o. daily.  7.  Ferrex 1 tablet p.o. at bedtime.  8.  Prednisone taper.  9.  Florinef 0.1 mg p.o. daily.  10.  Bupropion 150 mg per 12 hours total tablets, 1 tablet p.o. b.i.d.  11.  Bicalutamide 50 mg p.o. daily.  12.  Combivent Respimat 1 puff 4 times a day.  13.  Torsemide 40 mg p.o. daily.  14.  Synthroid 50 mcg p.o. daily.  15.  Augmentin 500 mg tablet p.o. b.i.d. for 8 more days.  16.  Toprol 25 mg p.o. in the evening.  17.  Potassium chloride 20 mEq p.o. daily.   DISCHARGE HOME OXYGEN: 2 liters.   DISCHARGE DIET: Renal diet.   DISCHARGE ACTIVITY: As tolerated. Followup.   FOLLOWUP INSTRUCTIONS: 1.  Neurology followup in 3 to 4 weeks without Dr. Cristopher Peru.  2.  Nephrology followup in 2 weeks.  3.  PCP followup in 1 to 2 weeks.  4.  Home hospice.   LABORATORY AND IMAGING  STUDIES PRIOR TO DISCHARGE:  1.  Sodium 135, potassium 3.6, chloride 93, bicarb 35, BUN 127, creatinine 3.09, glucose 177 and calcium of 8.9.  2.  CT chest without contrast showing borderline cardiomegaly with mild interstitial edema, small bilateral pleural effusions, left greater than right seen.   3.  WBC 13.7, hemoglobin 9.8, hematocrit 28.0, platelet count is 277.  4.  Ultrasound of the abdomen showing right pleural effusion and otherwise normal hepatic ultrasound.    BRIEF HOSPITAL COURSE: Mr. Brandow is an elderly 81 year old Caucasian male with multiple medical problems including severe Parkinson's disease, unable to tolerate any of the Parkinson's medications, chronic kidney disease stage 4, diastolic congestive heart failure, who has had recently multiple hospitalizations, was brought to the hospital secondary to worsening cough and shortness of breath.   1.  Acute respiratory failure requiring 2 liters of oxygen. The patient was not on any home oxygen. His chest x-ray indicated pulmonary edema so initially admitted for acute on chronic diastolic CHF exacerbation. The patient was already taking Lasix which was increased. He has also had evidence of pneumonia, possible aspiration pneumonia secondary to dysphagia from his worsening Parkinson's disease. He was seen by speech therapy, who has recommended thin liquids can be taken but with caution and aspiration precautions. He was initially  on Levaquin that was changed over to Zosyn while in the hospital and is being discharged on Augmentin. He also had reactive airway disease with significant wheezing for which he was treated with IV steroids which are being changed over to prednisone at the time of discharge. Regarding his diuretics, he is being changed over to torsemide decreased dose of 40 mg daily because of his worsening renal failure.  The patient will need 2 liters home oxygen and he will be followed by hospice.  2.  Acute on chronic renal failure  stage 4, baseline creatinine of 2.7 worsened up to 3.09 while in the hospital. Diuretics dose has been decreased because his p.o. intake has been reduced.  He was seen by a nephrologist, Dr. Cherylann RatelLateef. His urine output has remained marginal, however, the patient does not wish to proceed with any dialysis now or even in the near future.  3.  Parkinson's disease the patient has significant tremors which have been worsening over time. He follows with Dr. Cristopher PeruHemang Shah from neurology standpoint and has been tried on 3 different Parkinson's medications but was having severe side effects even with minimal doses. Dr. Sherryll BurgerShah was consulted to see if the patient could benefit from any Parkinson medication for symptomatic relief, however, the risks outweighed the benefits and patient and his wife decided not to be started on any medications at this time. Outpatient followup in 3 to 4 weeks is recommended at this point.  4.  History of prostate cancer. The patient is on bicalutamide at this point.  5.  Hypertension. The patient is on Toprol but he does have orthostatic hypotension for which he takes Florinef. His benazepril was held while in the hospital due to his acute on chronic renal failure at this time.  6.  He does have overall poor prognosis and was seen by palliative care in consultation per daughter's request. The hospice screen was ordered and the patient is being discharged home with hospice.   DISCHARGE CONDITION: Guarded with overall poor prognosis.   DISCHARGE DISPOSITION: Home with hospice.   TIME SPENT ON DISCHARGE: 45 minutes.    ____________________________ Enid Baasadhika Aldine Chakraborty, MD rk:cs D: 05/11/2013 13:16:51 ET T: 05/11/2013 14:14:16 ET JOB#: [id number]  cc: Enid Baasadhika Shirlie Enck, MD, <Dictator> Hemang K. Sherryll BurgerShah, MD Demetrios Isaacsonald E. Sherrie MustacheFisher, MD Lennox PippinsMunsoor N. Lateef, MD Enid BaasADHIKA Nathanel Tallman MD ELECTRONICALLY SIGNED 05/25/2013 15:59

## 2014-10-21 NOTE — Discharge Summary (Signed)
PATIENT NAME:  FREAD, KOTTKE MR#:  [id number] DATE OF BIRTH:  11-Apr-1928  DATE OF ADMISSION:  12/30/2012 DATE OF DISCHARGE:  01/04/2013  ADMITTING DIAGNOSIS: Shortness of breath, wheezing.   DISCHARGE DIAGNOSES: 1.  Shortness of breath due to acute systolic congestive heart failure as well as progressive valvular heart disease.  2.  Acute respiratory failure due to acute congestive heart failure, progressive valvular disease, with possible bronchospasm. The patient now on inhalers with improvement in his symptoms.  3.  Hyponatremia.  Felt to be due to volume overload. Sodium improved.  4.  Acute on chronic kidney failure. Renal function currently stable.  5.  Hypertension.  6.  Dysphagia with modified diet. Seen by speech pathology. Likely related to his Parkinson's disease.  7.  Parkinson's disease.  8.  Hyperlipidemia.  9.  Hypothyroidism.  10.  Depression.  11.  Generalized weakness and deconditioning.  Seen by PT who recommended home health with PT.  12.  Chronic anemia, normocytic and normochromic.  This is likely due to anemia of chronic disease.  13.  Hyperlipidemia.  14.  History of prostate cancer.  15.  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  16.  History of left hip fracture with repair.  17.  Cataract surgery.  18.  Status post prostate surgery.   CONSULTANTS: Nephrology, cardiology with Dr. Juliann Pares, and nephrology with Dr. Thedore Mins   PERTINENT LABORATORY AND EVALUATIONS: Chest x-ray on presentation showed increased interstitial marking and prominence of the pulmonary vascularity.  May reflect interstitial edema.   Troponin 0.05. CPK 113 and CK-MB 4.0. Urinalysis:  Nitrites negative, leukocytes negative. BMP: Glucose 133, BUN 41, creatinine 2.23, sodium 121, potassium 4.5, chloride 90, CO2 21, calcium 8.0. LFTs: AST 10, total protein 6.3. WBC 9.7, hemoglobin 9.2, platelet count 285.   EKG: Normal sinus rhythm with LVH.    Echocardiogram of the heart showed an EF of 40% to 45%, mild to moderately decreased global left ventricular systolic function, mildly dilated right atrium, mild thickening of the anterior and posterior mitral valve leaflets, severe mitral valve regurg and mild increase in left ventricular internal cavity size, mild aortic valve sclerosis without stenosis, mild to moderate aortic regurg, moderate tricuspid regurg, moderately elevated pulmonary artery systolic pressures.  Cortisol level was 17.7. Most recent BMP:  Sodium 127 and creatinine 2.36, on 07/06, date of discharge.   HOSPITAL COURSE: Please refer to H and P done by the admitting physician. The patient is an 81 year old white male with history of Parkinson's who also has a history of valvular heart disease who was having shortness of breath and cough for the past 3 weeks. He has been treated as an outpatient with antibiotics without any significant improvement. He came to the ED with complaint of having worsening shortness of breath, wheezing. The patient was noted to have acute CHF which is a new diagnosis for this patient. He was treated with IV Lasix initially and his sodium continued to remain low; therefore, he was switched to p.o. Lasix. He also had an echocardiogram which confirmed systolic heart dysfunction as well as multivalvular heart disease. The patient was seen by cardiology.  They recommended diuresis.  He was also followed by nephrology. He was also started on MDIs and inhalers. With these treatments, his shortness of breath resolved. The patient was also seen by physical therapy who recommended home with home health with PT. At this time, the patient is doing much better and is  stable for discharge. Discharge instructions for heart failure given.   DISCHARGE MEDICATIONS:  1.  Bupropion 150 mg 1 tab p.o. b.i.d. 2.  Loratadine 10 mg daily as needed. 3.  Fish oil 1200 mg 1 tab p.o. b.i.d. 4.  Ferrex 1 tab p.o. at bedtime. 5.   Centrum Silver 1 tab p.o. every morning. 6.  Cranberry 1 tab p.o. every morning. 7.  Vitamin D3 4000 international units every morning. 8.  Triamcinolone topically 0.1% apply topically to effected area daily. 9.  Bicalutamide 50 mg in the morning. 10.  Docusate 2 caps at bedtime. 11.  Carbidopa/levodopa 25/100 mg 2 tablets 3 times a day. 12.  Levothyroxine 50 mcg daily.  13.  Ranitidine 150 mg daily. 14.  Metoprolol succinate 25 mg p.o. daily. 15.  Norco 325/5 mg 1 tab p.o. q. 4 hours p.r.n. 16.  Fiber therapy 1 tab p.o. daily. 17.  Prednisone taper as he was taking previously. 18.  Aspirin 81 mg 1 tab p.o. daily. 19.  Optiva Sensitive 1 drop to effected eye daily. 20.  Benazepril 5 mg daily. 21.  Albuterol ipratropium 1 puff 4 times a day. 22.  Advair 250/50 one puff b.i.d. 23.  Lasix 40 mg daily.   HOME HEALTH: Yes with physical therapy and nurse.   DIET: Low-sodium, low-fat, low-cholesterol with regular consistency, mechanical soft, nectar thick liquids.  Please refer to dietary reference on the discharge for details.   ACTIVITY: As tolerated. The patient is to receive home PT.  DISCHARGE FOLLOWUP:  With primary MD in 1 to 2 weeks and Dr. Thedore MinsSingh this Friday.  TIME SPENT ON DISCHARGE: 35 minutes. ____________________________ Lacie ScottsShreyang H. Allena KatzPatel, MD shp:sb D: 01/05/2013 08:55:12 ET T: 01/05/2013 11:35:05 ET JOB#: [id number]  cc: Sherol Sabas H. Allena KatzPatel, MD, <Dictator> Charise CarwinSHREYANG H Dawson Albers MD ELECTRONICALLY SIGNED 01/12/2013 10:58
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
## 2014-10-21 NOTE — Op Note (Signed)
PATIENT NAME:  Travis Warren, Travis Warren MR#:  [id number] DATE OF BIRTH:  01/30/1928  DATE OF PROCEDURE:  11/12/2012  PREOPERATIVE DIAGNOSES:  1.  Mass in the left upper back.  2.  Subfascial mass of the left upper back; pathology pending.   POSTOPERATIVE DIAGNOSES:  1.  Mass in the left upper back.  2.  Subfascial mass of the left upper back; pathology pending.   PROCEDURE: Excision, mass of the left upper back.   SURGEON: Renda Rolls, M.D.   ANESTHESIA Intravenous sedation, monitored anesthesia care and local anesthesia.   INDICATIONS: This 81 year old presented with an enlarging mass of the left upper back, and it appeared that it may possibly be a lipoma, but diagnosis was in doubt and excision was recommended for definitive treatment.   The patient was placed on the operating table in the prone position and sedated and monitored by the anesthesia staff. The mass, which was just below the scapula, was examined. It appeared to be some 8 to 10 cm in dimension. The site was prepared with ChloraPrep and draped in a sterile manner.   The skin overlying the mass was infiltrated with 1% Xylocaine with epinephrine. An obliquely-oriented incision was made, removing an ellipse of skin which was approximately 1.8 cm in width, and the ellipse was approximately 9 cm in length. The incision was carried down through subcutaneous tissues. Numerous small bleeding points were cauterized. Excision was carried down through the superficial fascia and down to the latissimus dorsi muscle. The skin ellipse with its accompanying subcutaneous tissue was excised.   Next, the superficial fascia was dissected away from the underlying latissimus dorsi muscle so that the latissimus dorsi muscle was incised along the course of its fibers as a muscle-splitting incision. Next, the trapezius was encountered and was incised along the course of its fibers, which was somewhat perpendicular to the latissimus dorsi, and further dissected  using retractors for exposure. There was a mass which was encountered which dissected down to what appeared to be plane of dissection, and began to dissect and dissect along the cephalad border and determined this was immediately below the tip of the scapula. The scapula itself was retracted for further exposure, and a plane of dissection was followed. Subsequently there abrupt drainage and some serosanguineous fluid. Some of this was aspirated. A finger was inserted into this  fluid-filled mass and that helped to direct the course of dissection, and the mass was further dissected free from surrounding structures and also use of electrocautery to excise the mass. It appeared that a portion of a mass remained and was grasped and another portion of tissue and excised with electrocautery, and both sent in the same container with the skin ellipse for routine pathology. It appeared that there was some firmness within what appeared to be a thick capsule, and was sent for pathology for diagnosis. The wound was inspected. Several small bleeding-points were cauterized. The deeper tissues were infiltrated with 0.5% Sensorcaine with epinephrine, and also some additional Sensorcaine was injected in the subcutaneous tissues.   The trapezius was closed with interrupted 3-0 Vicryl. The latissimus dorsi was closed with interrupted 3-0 Vicryl. The Scarpa's fascia was closed with interrupted 4-0 Vicryl. The skin was closed with running 4-0 Monocryl subcuticular suture and Dermabond.   The patient tolerated surgery satisfactorily and was then prepared for transfer to the recovery room.    ____________________________ Shela Commons. Renda Rolls, MD jws:dm D: 11/12/2012 14:24:32 ET T: 11/12/2012 15:16:40 ET JOB#: [id number]  cc: J.  Renda RollsWilton Smith, MD, <Dictator> Adella HareWILTON J SMITH MD ELECTRONICALLY SIGNED 11/17/2012 10:20

## 2014-10-21 NOTE — H&P (Signed)
PATIENT NAME:  Travis Warren, SPIERING MR#:  [id number] DATE OF BIRTH:  Feb 04, 1928  DATE OF ADMISSION:  05/03/2013  PRIMARY CARE PHYSICIAN: Dr. Mila Merry.   REFERRING PHYSICIAN: Dr. Ella Bodo.   CHIEF COMPLAINT: Cough, shortness of breath.   HISTORY OF PRESENT ILLNESS: Travis Warren is an 81 year old, pleasant, white male with past medical history of hypertension, Parkinson's disease, chronic kidney disease, hypothyroidism, congestive heart failure. Presented to the Emergency Department with complaints of dry cough with wheezing. The patient has been experiencing severe generalized weakness. The patient's wife also felt that the patient has been gaining more weight. Gained about 4 pounds of weight in the last 4 to 5 days. Also noticed to have tightness in the stomach area. The patient at baseline is able to walk around the house; however, in the last few days, has been experiencing severe shortness of breath with mild exertion. Denies having any fever. Denies having any sick contacts. The patient was seen by primary care physician and was given antibiotic, doxycycline, without much improvement. Concerning the worsening of the symptoms, the patient's wife brought him to the Emergency Department. Workup in the Emergency Department with chest x-ray shows bilateral interstitial prominence. He is found to have elevation of BUN and creatinine from his baseline of 2.32 and 2.77. The patient has chronic hyponatremia. At baseline, sodium of 127 to 129, trended down to 123. Denies having orthopnea, PND. Denies having any chest pain, palpitations. The patient's wife also states that when the patient is trying to walk, it caused him to have severe generalized weakness and shakes all over the body for a few seconds. Concerning this, CT head also was obtained in the Emergency Department which did not show any acute abnormalities.   PAST MEDICAL HISTORY:  1. Hypertension.  2. Hyperlipidemia.  3. Parkinson's disease.  4.  Dementia.  5. Congestive heart failure.  6. Anemia of chronic disease.  7. Depression.   PAST SURGICAL HISTORY:  1. Left hip surgery.  2. Right knee surgery.  3. Prostatectomy.  4. Bone and skin graft due to  5. Hernia repair.   ALLERGIES:  1. SINEMET.  2. REQUIP.   HOME MEDICATIONS:  1. Vitamin D3 1000 mg once a day.   2. Ranitidine 150 mg once a day.  3. Midodrine 10 mg 3 times a day.  4. Toprol-XL 25 mg once daily.  5. Levothyroxine 50 mcg once a day.  6. Fludrocortisone once a day.  7. Fish oil 1000 mg 2 times a day.  8. Ferrex 1 tablet once a day.  9. Doxycycline 100 mg 2 times a day.  10. Docusate sodium 2 capsules once a day.  11. Cranberry 1 capsule once a day.  12. Centrum multivitamin 1 tablet once a day.  13. Bupropion 150 mg 2 times a day.  14. Bicalutamide 50 mg once a day.  15. Benazepril 5 mg once a day.   SOCIAL HISTORY: The patient never smoked; however, the patient had history of secondhand smoking from his parents. Denies drinking alcohol or using illicit drugs. Married, lives with his wife. Retired. Worked as a Chief Technology Officer.    FAMILY HISTORY: Both parents died at very young age. Father died at the age of 47 from lung cancer. Mother died from congestive heart failure.   REVIEW OF SYSTEMS:  CONSTITUTIONAL: Generalized weakness.  EYES: No change in vision.  ENT: No change of hearing. He uses hearing aid.  RESPIRATORY: Has cough, shortness of breath and wheezing.  CARDIOVASCULAR:  No chest pain, palpitations.  GASTROINTESTINAL: No nausea, vomiting, abdominal pain.  GENITOURINARY: No dysuria or hematuria.  ENDOCRINE: No polyuria or polydipsia.  HEMATOLOGIC: No easy bruising or bleeding.  SKIN: No rash or lesions.  MUSCULOSKELETAL: No joint pains and aches.  NEUROLOGIC: No weakness or numbness in any part of the body. Has history of Parkinson's disease.   PHYSICAL EXAMINATION:  GENERAL: This is a well-built, well-nourished, age-appropriate  male lying down in the bed, not in distress.  VITAL SIGNS: Temperature 98.2, pulse 69, blood pressure 175/92, respiratory rate of 16, oxygen saturation 96% on room air.  HEENT: Head normocephalic, atraumatic. There is no scleral icterus. Conjunctivae normal. Pupils equal and react to light. Extraocular movements are intact. Mucous membranes moist. No pharyngeal erythema.  NECK: Supple. No lymphadenopathy. No JVD. No carotid bruit. No thyromegaly.  CHEST: Has no focal tenderness. Bilateral wheezing. Also quiet breath sounds throughout the lungs.  HEART: S1 and S2 regular. No murmurs are heard.  ABDOMEN: Bowel sounds present. Soft, nontender, nondistended. No hepatosplenomegaly.  EXTREMITIES: No pedal edema. Pulses 2+.  SKIN: No rash or lesions.  MUSCULOSKELETAL: Good range of motion in all of the extremities.  NEUROLOGIC: The patient is alert, oriented to place, person and time. Cranial nerves II through XII intact. Motor 5/5 in upper and lower extremities.   LABORATORIES: Troponin 0.26, CK-MB of , CK of 69. UA negative for nitrites and leukocyte esterase. Chest x-ray, PA and lateral, showed interstitial prominence. CMP: BUN 55, creatinine of 2.77. The rest of all of the values are within normal limits.   CBC: WBC of 8.1, hemoglobin 9.5, platelet count of 276.   ASSESSMENT AND PLAN: Travis Warren is an 81 year old male who comes to the Emergency Department with cough, shortness of breath and wheezing.  1. Pneumonia and bronchitis: The patient has been treated with doxycycline. Will continue the treatment with Rocephin and Zithromax. The patient is afebrile. Concerning about the patient's severe generalized weakness and x-ray findings, will continue the antibiotics for another 3 days. Will also keep the patient on DuoNebs and Solu-Medrol.  2. Congestive heart failure: Could be mild. Will keep the patient on Lasix.  3. Chronic kidney disease: The patient has mild worsening from the baseline. This could be  from the renal insufficiency. Continue with the diuresis and follow up.  4. Severe debility: We will involve physical therapy and occupational therapy.  5. History of Parkinson's disease: Continue with the home medications.  6. Keep the patient on deep vein thrombosis prophylaxis with Lovenox.   TIME SPENT: 45 minutes.    ____________________________ Susa GriffinsPadmaja Syra Sirmons, MD pv:gb D: 05/03/2013 22:49:26 ET T: 05/03/2013 23:21:45 ET JOB#: [id number]  cc: Susa GriffinsPadmaja France Noyce, MD, <Dictator> Demetrios Isaacsonald E. Sherrie MustacheFisher, MD Susa GriffinsPADMAJA Osmar Howton MD ELECTRONICALLY SIGNED 05/30/2013 0:33

## 2014-10-21 NOTE — Consult Note (Signed)
Chief Complaint:  Subjective/Chief Complaint Pt states to be feeling slightly better. He has improved SOB but maybe having increased difficulty swallowing.   VITAL SIGNS/ANCILLARY NOTES: **Vital Signs.:   04-Jul-14 07:44  Vital Signs Type Routine  Temperature Temperature (F) 98.1  Celsius 36.7  Temperature Source oral  Pulse Pulse 66  Respirations Respirations 18  Systolic BP Systolic BP 099  Diastolic BP (mmHg) Diastolic BP (mmHg) 66  Mean BP 93  Pulse Ox % Pulse Ox % 98  Pulse Ox Activity Level  At rest  Oxygen Delivery 2L  *Intake and Output.:   Daily 04-Jul-14 07:00  Grand Totals Intake:  600 Output:  2675    Net:  -2075 24 Hr.:  -2075  Oral Intake      In:  600  Urine ml     Out:  2675  Length of Stay Totals Intake:  600 Output:  8338    Net:  -2875   Brief Assessment:  GEN well developed, well nourished, thin   Cardiac murmur present  -- LE edema  -- JVD   Respiratory normal resp effort  clear BS   Gastrointestinal Normal   Gastrointestinal details normal Soft   EXTR negative cyanosis/clubbing, negative edema   Additional Physical Exam Atrophy right arm   Lab Results: Thyroid:  04-Jul-14 05:30   Thyroid Stimulating Hormone 1.89 (0.45-4.50 (International Unit)  ----------------------- Pregnant patients have  different reference  ranges for TSH:  - - - - - - - - - -  Pregnant, first trimetser:  0.36 - 2.50 uIU/mL)  Routine Chem:  04-Jul-14 05:30   Glucose, Serum 96  BUN  51  Creatinine (comp)  2.40  Sodium, Serum  122  Potassium, Serum  3.4  Chloride, Serum  87  CO2, Serum 29  Calcium (Total), Serum  7.9  Anion Gap  6  Osmolality (calc) 259  eGFR (African American)  28  eGFR (Non-African American)  24 (eGFR values <69m/min/1.73 m2 may be an indication of chronic kidney disease (CKD). Calculated eGFR is useful in patients with stable renal function. The eGFR calculation will not be reliable in acutely ill patients when serum creatinine  is changing rapidly. It is not useful in  patients on dialysis. The eGFR calculation may not be applicable to patients at the low and high extremes of body sizes, pregnant women, and vegetarians.)  Routine Hem:  04-Jul-14 05:30   WBC (CBC)  11.3  RBC (CBC)  2.80  Hemoglobin (CBC)  9.3  Hematocrit (CBC)  26.1  Platelet Count (CBC) 241  MCV 93  MCH 33.1  MCHC 35.6  RDW 12.4  Neutrophil % 71.0  Lymphocyte % 12.6  Monocyte % 14.4  Eosinophil % 1.8  Basophil % 0.2  Neutrophil #  8.1  Lymphocyte # 1.4  Monocyte #  1.6  Eosinophil # 0.2  Basophil # 0.0 (Result(s) reported on 01 Jan 2013 at 0[facility])   Radiology Results: Cardiology:    02-Jul-14 17:32, ECG  Ventricular Rate 71  Atrial Rate 71  P-R Interval 184  QRS Duration 84  QT 436  QTc 473  P Axis 13  R Axis 6  T Axis 105  ECG interpretation   Normal sinus rhythm  Left ventricular hypertrophy with repolarization abnormality  Abnormal ECG  When compared with ECG of 05-Nov-2012 11:25,  No significant change was found  ----------unconfirmed----------  Confirmed by OVERREAD, NOT ([id number]), editor PEARSON, BARBARA ([id number]) on 12/31/2012 8:18:48 AM  ECG     03-Jul-14  08:26, Echo Doppler  Echo Doppler   REASON FOR EXAM:      COMMENTS:       PROCEDURE: [place] - ECHO DOPPLER COMPLETE(TRANSTHOR)  - Dec 31 2012  8:26AM     RESULT: Echocardiogram Report    Patient Name:   Travis Warren Date of Exam: 12/31/2012  Medical Rec #:  [id number]         Custom1:  Date of Birth:  1928-01-24      Height:       68.1 in  Patient Age:    81 years       Weight:       152.1 lb  Patient Gender: M              BSA:          1.82 m??    Indications: CHF  Sonographer:    Sherrie Sport RDCS  Referring Phys: Wilfred Curtis, M    Summary:   1. Left ventricular ejection fraction, by visual estimation, is 40 to   45%.   2. Mildly to moderately decreased global left ventricular systolic   function.   3. Mildly dilated right atrium.   4. Mild thickening of  the anterior and posterior mitral valve leaflets.   5. Severe mitral valve regurgitation.   6. Mildly increased left ventricular internal cavity size.   7. Mild aortic valve sclerosis without stenosis.   8. Mild to moderate aortic regurgitation.   9. Moderate tricuspid regurgitation.  10. Moderately elevated pulmonary artery systolic pressure.  11. Mildly increased left ventricular posterior wall thickness.  12. Thickened tricuspid valve.  2D AND M-MODE MEASUREMENTS (normal ranges within parentheses):  Left Ventricle: Normal  IVSd (2D):      1.14 cm (0.7-1.1)  LVPWd (2D):     1.19 cm (0.7-1.1) Aorta/LA:                  Normal  LVIDd (2D):     4.92 cm (3.4-5.7) Aortic Root (2D): 2.90 cm (2.4-3.7)  LVIDs (2D):     4.15 cm           Left Atrium (2D): 4.30 cm (1.9-4.0)  LV FS (2D):     15.7 %   (>25%)  LV EF (2D):     32.9 %   (>50%)                                    Right Ventricle:                                    RVd (2D):        4.27 cm  LV SYSTOLIC FUNCTION BY 2D PLANIMETRY (MOD):  EF-A4C View: 41.6 % EF-A2C View: 44.3 % EF-Biplane: 06.2 %  LV DIASTOLIC FUNCTION:  MV Peak E: 0.98 m/s E/e' Ratio: 17.40  MV Peak A: 0.57 m/s Decel Time: 211 msec  E/A Ratio: 1.73  SPECTRAL DOPPLER ANALYSIS (where applicable):  Mitral Valve:  MV P1/2 Time: 61.19 msec  MV Area, PHT: 3.60 cm??  Mitral Insufficiency by PISA:  MR Volume: 90.38 ml MR Flow Rate: 247.60 ml/s MR EROA: 47 mm??  Aortic Valve: AoV Max Vel: 1.14 m/s AoV Peak PG: 5.2 mmHg AoV Mean PG:  LVOT Vmax: 0.49 m/s LVOT VTI:  LVOT Diameter: 2.10 cm  AoV Area,Vmax: 1.49 cm?? AoV Area, VTI:  AoV Area, Vmn:  Aortic Insufficiency:  AI Half-time:  466 msec  AI Decel Rate: 2.23 m/s??  Tricuspid Valve and PA/RV Systolic Pressure: TR Max Velocity: 3.78 m/s RA   Pressure: 5 mmHg RVSP/PASP: 62.0 mmHg  Pulmonic Valve:  PV Max Velocity: 0.72 m/s PV Max PG: 2.1 mmHg PV Mean PG:    PHYSICIAN INTERPRETATION:  Left Ventricle: The left  ventricular internal cavity size was mildly   increased. LV septal wall thickness was normal. LV posterior wall   thickness was mildly increased. Global LV systolic function was mildly to   moderately decreased. Left ventricular ejection fraction, by visual   estimation, is 40 to 45%.  Right Ventricle: The right ventricular size is normal.  Left Atrium: The left atrium is normal in size.  Right Atrium: The right atrium is mildly dilated.  Pericardium: There is no evidence of pericardial effusion.  Mitral Valve: The mitral valve is normal in structure. There is mild   thickening of the anterior and posterior mitral valve leaflets.Severe   mitral valve regurgitation is seen.  Tricuspid Valve: The tricuspid valve is thickened. Moderate tricuspid   regurgitation is visualized. The tricuspid regurgitant velocity is 3.78   m/s, and with an assumed right atrial pressure of 5 mmHg, the estimated   right ventricular systolic pressure is moderately elevated at 62.0 mmHg.  Aortic Valve: The aortic valve is normal. Mild aortic valve sclerosis is   present, with no evidence of aortic valve stenosis. Mild to moderate   aortic valve regurgitation is seen.  Pulmonic Valve: The pulmonic valve is not well seen.    Tensed MD  Electronically signed by [place] Lujean Amel MD  Signature Date/Time: 12/31/2012/4:48:19 PM  *** Final ***    IMPRESSION: .        Verified By: Yolonda Kida, M.D., MD   Assessment/Plan:  Invasive Device Daily Assessment of Necessity:  Does the patient currently have any of the following indwelling devices? none   Assessment/Plan:  Assessment IMP SOB Severe MR/ Valve disease CHF CRI Parkinsons HTN Depression Hyperlipidemia Aspiration? .   Plan PLAN Tele Agree with diuresis Continue Bp control Monitor renal insuff Medical therapy for valvular disease O2 [place] for sob Speech to assess swallowing OT/PT for incresaed activity Out of bed to  chair Continue Parkinson meds   Electronic Signatures: Yolonda Kida (MD)  (Signed 05-Jul-14 05:44)  Authored: Chief Complaint, VITAL SIGNS/ANCILLARY NOTES, Brief Assessment, Lab Results, Radiology Results, Assessment/Plan   Last Updated: 05-Jul-14 05:44 by Lujean Amel D (MD)

## 2014-10-21 NOTE — Consult Note (Signed)
Brief Consult Note: Diagnosis: SOB CHF Bronchitis MR.   Patient was seen by consultant.   Consult note dictated.   Recommend further assessment or treatment.   Orders entered.   Discussed with Attending MD.   Comments: IMP SOB CHF Bronchitits Pneumonia Severe MR CRI Parkinsons GERD HTN Hyperlipidemia . PLAN Tele 02 Lasix for CHF Hydralazine/Imdur for CHF Nephology for CRI Agree with ECHO Agree with ACE for HTN/CHF Continue Parkinson therapy Medical therpay for now.  Electronic Signatures: Dorothyann Pengallwood, Obie Kallenbach D (MD)  (Signed 03-Jul-14 15:50)  Authored: Brief Consult Note   Last Updated: 03-Jul-14 15:50 by Dorothyann Pengallwood, Jannifer Fischler D (MD)

## 2014-10-21 NOTE — Consult Note (Signed)
Chief Complaint:  Subjective/Chief Complaint He feels stronger today. More energy. Ambulated with walker earlier. Cough is better. S/P PT and speech today.   VITAL SIGNS/ANCILLARY NOTES: **Vital Signs.:   05-Jul-14 15:55  Vital Signs Type Routine  Temperature Temperature (F) 98.1  Celsius 36.7  Temperature Source oral  Pulse Pulse 69  Respirations Respirations 18  Systolic BP Systolic BP 381  Diastolic BP (mmHg) Diastolic BP (mmHg) 67  Mean BP 97  Pulse Ox % Pulse Ox % 96  Pulse Ox Activity Level  At rest  Oxygen Delivery Room Air/ 21 %  *Intake and Output.:   Shift 05-Jul-14 15:00  Grand Totals Intake:  480 Output:  1000    Net:  -520 24 Hr.:  -520  Oral Intake      In:  480  Urine ml     Out:  1000  Length of Stay Totals Intake:  1560 Output:  6725    Net:  -5165   Brief Assessment:  GEN well developed, well nourished, no acute distress   Cardiac Regular  murmur present   Respiratory normal resp effort   Gastrointestinal details normal Soft   EXTR negative cyanosis/clubbing, negative edema   Lab Results: Thyroid:  04-Jul-14 05:30   Thyroid Stimulating Hormone 1.89 (0.45-4.50 (International Unit)  ----------------------- Pregnant patients have  different reference  ranges for TSH:  - - - - - - - - - -  Pregnant, first trimetser:  0.36 - 2.50 uIU/mL)  Hepatic:  05-Jul-14 05:40   Albumin, Serum  2.7  General Ref:  04-Jul-14 05:30   Cortisol, Serum RIA ========== TEST NAME ==========  ========= RESULTS =========  = REFERENCE RANGE =  CORTISOL  Cortisol Cortisol                        [   17.7 ug/dL           ]          2.3-19.4                                        Cortisol AM         6.2 - 19.4                                        Cortisol PM         2.3 - 11.9               [facility]            No: [id number]           [address] [place], [place] [address]           Travis Romp, MD         [phone]   Result(s)  reported on 02 Jan 2013 at 09:19PM.  Routine Chem:  04-Jul-14 05:30   Glucose, Serum 96  BUN  51  Creatinine (comp)  2.40  Sodium, Serum  122  Potassium, Serum  3.4  Chloride, Serum  87  CO2, Serum 29  Calcium (Total), Serum  7.9  Anion Gap  6  Osmolality (calc) 259  eGFR (African American)  28  eGFR (Non-African American)  24 (eGFR values <38m/min/1.73 m2 may be an indication  of chronic kidney disease (CKD). Calculated eGFR is useful in patients with stable renal function. The eGFR calculation will not be reliable in acutely ill patients when serum creatinine is changing rapidly. It is not useful in  patients on dialysis. The eGFR calculation may not be applicable to patients at the low and high extremes of body sizes, pregnant women, and vegetarians.)  05-Jul-14 05:40   Glucose, Serum 96  BUN  57  Creatinine (comp)  2.25  Sodium, Serum  121  Potassium, Serum  3.3  Chloride, Serum  81  CO2, Serum 31  Calcium (Total), Serum  8.2  Anion Gap 9  Osmolality (calc) 260  eGFR (African American)  30  eGFR (Non-African American)  26 (eGFR values <65m/min/1.73 m2 may be an indication of chronic kidney disease (CKD). Calculated eGFR is useful in patients with stable renal function. The eGFR calculation will not be reliable in acutely ill patients when serum creatinine is changing rapidly. It is not useful in  patients on dialysis. The eGFR calculation may not be applicable to patients at the low and high extremes of body sizes, pregnant women, and vegetarians.)  Phosphorus, Serum 3.9  Routine Hem:  04-Jul-14 05:30   WBC (CBC)  11.3  RBC (CBC)  2.80  Hemoglobin (CBC)  9.3  Hematocrit (CBC)  26.1  Platelet Count (CBC) 241  MCV 93  MCH 33.1  MCHC 35.6  RDW 12.4  Neutrophil % 71.0  Lymphocyte % 12.6  Monocyte % 14.4  Eosinophil % 1.8  Basophil % 0.2  Neutrophil #  8.1  Lymphocyte # 1.4  Monocyte #  1.6  Eosinophil # 0.2  Basophil # 0.0 (Result(s) reported on 01 Jan 2013 at 0[facility])   Radiology Results: Cardiology:    02-Jul-14 17:32, ECG  Ventricular Rate 71  Atrial Rate 71  P-R Interval 184  QRS Duration 84  QT 436  QTc 473  P Axis 13  R Axis 6  T Axis 105  ECG interpretation   Normal sinus rhythm  Left ventricular hypertrophy with repolarization abnormality  Abnormal ECG  When compared with ECG of 05-Nov-2012 11:25,  No significant change was found  ----------unconfirmed----------  Confirmed by OVERREAD, NOT ([id number]), editor PEARSON, BARBARA (39 on 12/31/2012 8:18:48 AM  ECG     03-Jul-14 08:26, Echo Doppler  Echo Doppler   REASON FOR EXAM:      COMMENTS:       [place]TE(TRANSTHOR)  - Dec 31 2012  8:26AM     RESULT: Echocardiogram Report    Patient Name:   Travis COSTERDate of Exam: 12/31/2012  Medical Rec #:  [id number]        Custom1:  Date of Birth:  302-20-1929     Height:       68.1 in  Patient Age:    89years       Weight:       152.1 lb  Patient Gender: M              BSA:          1.82 m??    Indications: CHF  Sonographer:    JSherrie SportRDCS  Referring Phys: DWilfred Curtis M    Summary:   1. Left ventricular ejection fraction, by visual estimation, is 40 to   45%.   2. Mildly to moderately decreased global left ventricular systolic   function.   3. Mildly dilated right atrium.  4. Mild thickening of the anterior and posterior mitral valve leaflets.   5. Severe mitral valve regurgitation.   6. Mildly increased left ventricular internal cavity size.   7. Mild aortic valve sclerosis without stenosis.   8. Mild to moderate aortic regurgitation.   9. Moderate tricuspid regurgitation.  10. Moderately elevated pulmonary artery systolic pressure.  11. Mildly increased left ventricular posterior wall thickness.  12. Thickened tricuspid valve.  2D AND M-MODE MEASUREMENTS (normal ranges within parentheses):  Left Ventricle: Normal  IVSd (2D):      1.14 cm (0.7-1.1)  LVPWd (2D):     1.19 cm  (0.7-1.1) Aorta/LA:                  Normal  LVIDd (2D):     4.92 cm (3.4-5.7) Aortic Root (2D): 2.90 cm (2.4-3.7)  LVIDs (2D):     4.15 cm           Left Atrium (2D): 4.30 cm (1.9-4.0)  LV FS (2D):     15.7 %   (>25%)  LV EF (2D):     32.9 %   (>50%)                                    Right Ventricle:                                    RVd (2D):        4.40 cm  LV SYSTOLIC FUNCTION BY 2D PLANIMETRY (MOD):  EF-A4C View: 41.6 % EF-A2C View: 44.3 % EF-Biplane: 34.7 %  LV DIASTOLIC FUNCTION:  MV Peak E: 0.98 m/s E/e' Ratio: 17.40  MV Peak A: 0.57 m/s Decel Time: 211 msec  E/A Ratio: 1.73  SPECTRAL DOPPLER ANALYSIS (where applicable):  Mitral Valve:  MV P1/2 Time: 61.19 msec  MV Area, PHT: 3.60 cm??  Mitral Insufficiency by PISA:  MR Volume: 90.38 ml MR Flow Rate: 247.60 ml/s MR EROA: 47 mm??  Aortic Valve: AoV Max Vel: 1.14 m/s AoV Peak PG: 5.2 mmHg AoV Mean PG:  LVOT Vmax: 0.49 m/s LVOT VTI:  LVOT Diameter: 2.10 cm  AoV Area,Vmax: 1.49 cm?? AoV Area, VTI:  AoV Area, Vmn:  Aortic Insufficiency:  AI Half-time:  466 msec  AI Decel Rate: 2.23 m/s??  Tricuspid Valve and PA/RV Systolic Pressure: TR Max Velocity: 3.78 m/s RA   Pressure: 5 mmHg RVSP/PASP: 62.0 mmHg  Pulmonic Valve:  PV Max Velocity: 0.72 m/s PV Max PG: 2.1 mmHg PV Mean PG:    PHYSICIAN INTERPRETATION:  Left Ventricle: The left ventricular internal cavity size was mildly   increased. LV septal wall thickness was normal. LV posterior wall   thickness was mildly increased. Global LV systolic function was mildly to   moderately decreased. Left ventricular ejection fraction, by visual   estimation, is 40 to 45%.  Right Ventricle: The right ventricular size is normal.  Left Atrium: The left atrium is normal in size.  Right Atrium: The right atrium is mildly dilated.  Pericardium: There is no evidence of pericardial effusion.  Mitral Valve: The mitral valve is normal in structure. There is mild   thickening of the anterior  and posterior mitral valve leaflets.Severe   mitral valve regurgitation is seen.  Tricuspid Valve: The tricuspid valve is thickened. Moderate tricuspid   regurgitation is visualized. The  tricuspid regurgitant velocity is 3.78   m/s, and with an assumed right atrial pressure of 5 mmHg, the estimated   right ventricular systolic pressure is moderately elevated at 62.0 mmHg.  Aortic Valve: The aortic valve is normal. Mild aortic valve sclerosis is   present, with no evidence of aortic valve stenosis. Mild to moderate   aortic valve regurgitation is seen.  Pulmonic Valve: The pulmonic valve is not well seen.    [place] MD  Electronically signed by [place] Lujean Amel MD  Signature Date/Time: 12/31/2012/4:48:19 PM  *** Final ***    IMPRESSION: .        Verified By: Yolonda Kida, M.D., MD   Assessment/Plan:  Invasive Device Daily Assessment of Necessity:  Does the patient currently have any of the following indwelling devices? none   Assessment/Plan:  Assessment IMP Improved from yesterday SOB better Severe MR/ Valve disease CHF CRI Parkinsons HTN Depression Hyperlipidemia Aspiration? .   Plan PLAN Tele Agree with diuresis / low dose Continue Bp control Monitor renal insuff Medical therapy for valvular disease O2 [place] for sob Appreciate Speech to assess swallowing  Continue OT/PT for incresaed activity Out of bed to chair Continue Parkinson meds   Electronic Signatures: Yolonda Kida (MD)  (Signed 06-Jul-14 08:44)  Authored: Chief Complaint, VITAL SIGNS/ANCILLARY NOTES, Brief Assessment, Lab Results, Radiology Results, Assessment/Plan   Last Updated: 06-Jul-14 08:44 by Lujean Amel D (MD)

## 2014-10-21 NOTE — H&P (Signed)
PATIENT NAME:  EYTHAN, JAYNE MR#:  [id number] DATE OF BIRTH:  May 25, 1928  PRIMARY CARE PHYSICIAN: Dr. Sherrie Mustache.  REFERRING PHYSICIAN: Dr. Fanny Bien.   CHIEF COMPLAINT: Near syncope today.   HISTORY OF PRESENT ILLNESS: This is an 81 year old Caucasian male with a history of hypertension, CKD, CHF, anemia who presents in the ED with near-syncope today. The patient is alert, awake, oriented, in no acute distress. According to the patient and the patient's wife, the patient has been feeling weak for the past 1 week. The patient had lightheadedness while  standing from the bathroom today. The patient almost passed out, but the patient denies any chest pain, palpitations. No orthopnea or nocturnal dyspnea. No cough, sputum, shortness of breath or hemoptysis. The patient has a good appetite. Denies any weight gain or weight loss.   The patient was noted to have an increased BUN and creatinine and hyponatremia at 127.   PAST MEDICAL HISTORY: Hypertension, CKD, anemia, hyperlipidemia, hypothyroidism, prostate cancer, depression, Parkinson disease.   PAST SURGICAL HISTORY: Benign tumor removal from the upper back. 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 History of left hip fracture surgery, cataract surgery, prostate surgery.   SOCIAL HISTORY: No smoking or drinking or illicit drugs.   FAMILY HISTORY: Both parents died young. Father died of lung cancer. Mother died of ovarian cancer.   ALLERGIES: REQUIP, SINEMET.   HOME MEDICATIONS:  1.  Vitamin D3, 1000 international units 1 tablet once a day.  2.  Ranitidine 150 mg p.o. once a day.  3.  Midodrine 5 mg p.o. t.i.d.  4.  Lopressor 25 mg p.o. daily.  5.  Levothyroxine 50 mcg p.o. daily.  6.  Lasix 40 mg p.o. b.i.d.  7.  Fish oil 1000 mg one cap b.i.d.  8.  Ferrex 100 mg capsule one tab once a day at bedtime.  9.  Colace 100 mg two caps once a day at bedtime.  10.  Cranberry oral capsule one cap once a day.  11.  Centrum Silver1 tab once a day.  12.  Bupropion 150 mg  every 12 hours tablets 1 tab b.i.d.  13.  Bicalutamide 50 mg p.o. daily.  14.  Benazepril 5 mg p.o. daily.     REVIEW OF SYSTEMS:  CONSTITUTIONAL: The patient denies any fever or chills. No headache but has dizziness, lightheadedness and weakness. No weight gain or weight loss.  EYES: No double vision or blurry vision.  ENT: No postnasal drip, slurred speech or dysphagia.  CARDIOVASCULAR: No chest pain, palpitation, orthopnea, or nocturnal dyspnea. No leg edema.  PULMONARY: No cough, sputum, shortness of breath. No hemoptysis.  GASTROINTESTINAL: No abdominal pain, nausea, vomiting or diarrhea. No melena or bloody stool.  GENITOURINARY: No dysuria, hematuria, or incontinence.  SKIN: No rash or jaundice.  NEUROLOGY: No syncope, loss of consciousness or seizure but has lightheadedness and near syncope and generalized weakness.  HEMATOLOGY: No easy bruising or bleeding.  ENDOCRINE: No polyuria, polydipsia, heat or cold intolerance.   PHYSICAL EXAMINATION:  VITAL SIGNS: Temperature 97.9, blood pressure 143/67, pulse 58. Oxygen saturation 98% on room air.  GENERAL: Alert, awake, oriented, in no acute distress.  HEENT: Pupils round, equal and reactive to light and accommodation. Moist oral mucosa. Clear oropharynx.  NECK: Supple. No JVD or carotid bruits. No lymphadenopathy.  CARDIOVASCULAR: S1, S2, regular rate, rhythm. No murmurs or gallops.  PULMONARY: Bilateral air entry. No wheezing or rales. No use of accessory muscle to breathe.   ABDOMEN: Soft. No distention. No tenderness. No organomegaly. Bowel  sounds present.  EXTREMITIES: No edema, clubbing or cyanosis. No calf tenderness. Strong bilateral pedal pulses.  SKIN: No rash or jaundice but has decreased turgor.  NEUROLOGIC: A and O x3. No focal deficit. Power 5/5. Sensation intact. Deep tendon reflexes 2+.   DIAGNOSTIC STUDIES:  1.  CAT scan of head stable.  2.  Urinalysis is negative.  3.  Chest x-ray: No evidence of pneumonia or CHF.   4.  WBC 6.3, hemoglobin 10.4, platelets 274.  5.  Glucose 104, BUN 52, creatinine 2.73, sodium 127, potassium 4.1, chloride 92, bicarb 29, troponin 0.04, magnesium 2.2.  6.  EKG shows sinus bradycardia with a 1st AV block at 54 BPM. Prolonged QT.   IMPRESSIONS:  1.  Acute renal failure and chronic kidney disease.  2.  Near syncopal, possibly due to acute renal failure.  3.  Hyponatremia.  4.  Anemia.  5.  Congestive heart failure.  6.  Parkinson disease.   PLAN OF TREATMENT:  1.  The patient will be admitted to the medical floor. We will hold the Lasix and benazepril due to acute renal failure. We will start him on normal saline IV and follow up BMP and magnesium level.  2.  We will check orthostatic blood pressure and to continue Midodrine.  3.  We will continue other home medications.  4.  GI and DVT prophylaxes.  5.  I discussed the patient's condition and plan of treatment with the patient and the patient's wife.   CODE STATUS: THE PATIENT WANTS FULL CODE.   TIME SPENT: About 56 minutes.   ____________________________ Shaune PollackQing Mylan Lengyel, MD qc:np D: 04/16/2013 19:06:04 ET T: 04/16/2013 20:27:36 ET JOB#: [id number]  cc: Shaune PollackQing Birdena Kingma, MD, <Dictator> Shaune PollackQING Tyjay Galindo MD ELECTRONICALLY SIGNED 04/19/2013 12:41

## 2014-10-21 NOTE — Discharge Summary (Signed)
PATIENT NAME:  Travis Warren, Travis Warren MR#:  [id number] DATE OF BIRTH:  07-16-1927  DATE OF ADMISSION:  04/16/2013 DATE OF DISCHARGE:  04/18/2013  ADMITTING DIAGNOSIS:  Syncope.  DISCHARGE DIAGNOSES: 1.  Syncope, likely due to orthostatic hypotension.  2.  Orthostatic hypotension due to dehydration as well as autonomic dysfunction to Parkinson disease.  3.  Malignant hypertension.  4.  Acute on chronic renal failure, likely dehydration-related, better on IV fluids. 5.  Hyponatremia, resolved on IV fluids. 6.  History of hypertension, hyperlipidemia, chronic kidney disease, anemia, hypothyroidism, prostate carcinoma, depression, Parkinson disease.   DISCHARGE CONDITION: Stable.   DISCHARGE MEDICATIONS: The patient is to continue bupropion extended release 150 mg  p.o. twice daily. 1.  Ferrex 28 one tablet once at bedtime. 2.  Centrum Silver 1 daily. 3.  (Dictation Anomaly) Cranberry 1 capsule once daily. 4.  Bicalutamide 50 mg p.o. daily 5.  Docusate sodium 100 mg 2 capsules once at bedtime.  6.  Levothyroxine 50 mcg p.o. daily.  7.  Ranitidine150 mg p.o. daily.  8.  Metoprolol succinate extended-release 25 mg p.o. daily to be taken in the evening.  9.  Benazepril 5 mg p.o. daily to be taken in the evening, preferably.   10.  Fish oil 1 grams twice daily.  11.  Vitamin D3 1000 units once daily.  12.  New medication fludrocortisone 0.1 mg once daily.  13.  Midodrine 10 mg p.o. 3 times daily.  14.  Nepro Carb Steady oral supplements 237 mL orally twice daily. The patient was advised not to take Lasix unless recommended by primary care physician or other M.D. Home health with physical therapy as well as nurse to follow orthostatic vital signs as well as balance and muscle strength improvement with physical therapy.  HOME OXYGEN: None.   DIET: 2 grams salt, low fat, low cholesterol. Diet consistency: Regular.   ACTIVITY LIMITATIONS: As tolerated.   REFERRALS: To home health physical therapy  as well as Warren.N. follow-up.   FOLLOWUP APPOINTMENT: With Dr. Sherrie MustacheFisher in two days after discharge, as well as Drs. (Dictation Anomaly) Fisher, DR Thedore MinsSingh, nephrologist, in two days after discharge.   CONSULTANTS: Care management, social work, Dr. Wynelle LinkKolluru, nephrology.   RADIOLOGIC STUDIES: Chest x-ray, PA and lateral, 04/16/2013, revealed no evidence of pneumonia or congestive heart failure. May be an element of underlying chronic obstructive pulmonary disease, according to radiologist. CT scan of head without contrast, 04/16/2013, revealed stable CT of the brain, changes of atrophy with chronic microvascular ischemic disease were noted, but no acute intracranial abnormality evident and no significant change since prior study. Carotid ultrasound, 04/18/2013, revealed atherosclerotic disease without evidence of hemodynamically significant stenosis. Echocardiogram was performed; however, results were not available at the time of dictation.   HISTORY OF PRESENT ILLNESS: The patient is an 81 year old Caucasian male with a history of Parkinson disease, also orthostatic hypotension, who presented to the hospital with complaints of near syncope. Please refer to Dr. Nicky Pughhen's admission note on 04/16/2013. On arrival to the hospital, the patient was noted to have elevated BUN as well as creatinine, as well as hyponatremia.  His vital signs showed temperature of 97.9, blood pressure was 143/67, pulse was 58, oxygen saturation was 98% on room air. Physical exam was unremarkable.  The patient's lab data done on admission, 04/16/2013, revealed BUN and creatinine elevated to 52 and 2.73, with baseline creatinine of about 2.2. Sodium was 127, glucose was 104. Otherwise, BMP was unremarkable. The patient's troponin was 0.04. TSH was  normal at 3.34. White blood cell count was normal at 6.3, hemoglobin was 10.4, and platelet count was 274. Urinalysis was unremarkable.   The patient was admitted to the hospital for further  evaluation, and his orthostatic vital signs were checked on admission, and the patient was noted to have significant orthostatic hypotension. The patient's blood pressure drifted down from 151 while he was lying to 100 whenever he was standing, on 04/17/2013. It was felt that the patient was very likely dehydrated, and his Lasix was stopped and IV fluids were administered. With this, the patient's condition somewhat improved, and his orthostatic hypotension improved. However, the patient was still having problems with orthostatic changes, so his midodrine was advanced to 10 mg 3 times daily dose and finally fludrocortisone was added to improve his orthostatic vital signs, with which patient's orthostasis somewhat improved. Patient was recommended to continue those medications and follow his blood pressure readings very closely. He will be given a home health nurse to follow him along and advise in regard to management of his orthostatic hypertension as well as risks associated with orthostatic hypotension.   We also evaluated him for other risks in regard to near syncope, and we had echocardiogram done; however, those results are not available. We also had a carotid ultrasound, which was unremarkable. The patient was noted to have malignant hypertension. On arrival to the hospital, the patient's blood pressure was as high as 180.  It was felt to be stress related, however. The patient was continued on his usual outpatient medications, and his blood pressure somewhat improved whenever his stress level decreased. Patient's family were advised to continue medications, but blood pressure medications (Dictation Anomaly) should be given, if possible, at bedtime to diminish the orthostatic hypotension.   In regard to acute on chronic renal failure, as mentioned above, the patient's creatinine was 2.7. On arrival to the hospital with IV fluid administration, patient's creatinine improved to 2.38.  The patient was  advised to stop his Lasix and follow up with his nephrologist as well as primary care physician in regard to reinstitution of Lasix if needed.   In regard to hyponatremia, again, it was likely dehydration-related, as when the patient's Lasix was stopped as well as IV fluids were administered, the patient's sodium level improved to 134 on the day of discharge, 04/18/2013. It is recommended to follow the patient's creatinine as well as sodium levels as outpatient, and make decisions about his medications including liberalization of salt intake as outpatient.  In regard to history of hypertension, hyperlipidemia, chronic kidney disease, anemia, hypothyroidism, prostate carcinoma, depression, as well as Parkinson disease, as mentioned above, no changes were made.   The patient is being discharged in stable condition with the above-mentioned medications and follow-up. His vital signs on the day of discharge: Temperature was 97.5, pulse was 62 to 80s, respiratory rate was 18, blood pressure ranging from 130 to 151 systolic and 50s to 70s diastolic. Oxygen saturations were 98% on room air at rest.   TIME SPENT: 40 minutes.     ____________________________ Katharina Caper, MD rv:cg D: 04/18/2013 17:56:21 ET T: 04/19/2013 01:36:05 ET JOB#: [id number]  cc: Katharina Caper, MD, <Dictator> Demetrios Isaacs. Sherrie Mustache, MD (Dictation Anomaly) Khrystina Bonnes MD ELECTRONICALLY SIGNED 05/02/2013 19:50

## 2014-10-23 NOTE — Discharge Summary (Signed)
PATIENT NAME:  Travis Warren, Travis Warren MR#:  [id number] DATE OF BIRTH:  04-10-1928  DATE OF ADMISSION:  10/26/2011 DATE OF DISCHARGE:  10/29/2011  FINAL DIAGNOSES:  1. Displaced intertrochanteric fracture of the left hip.  2. History of prostate cancer.  3. Chronic anemia. 4. Chronic renal insufficiency.  5. Benign hypertension.  6. Hyperlipidemia.  7. Hypothyroidism.  8. Depression.   MEDICATIONS:  1. Wellbutrin 75 mg b.i.d.  2. Pravachol 20 mg daily.  3. Synthroid 0.05 mg daily.  4. Vitamin D 2000 units daily.  5. Fish oil.  6. Iron sulfate.  7. Multivitamins.  8. Casodex 50 mg daily.  9. Lotensin 20 mg p.o. daily.  10. Norvasc 10 mg daily.  11. Aspirin 81 mg daily.  DISCHARGE MEDICATIONS: 1. Enteric-coated aspirin 1 p.o. b.i.d.  2. Norco 3/325 q. 6 h. p.Warren.n. pain.     OPERATIONS:  10/26/2011 left compression hip nailing.  COMPLICATIONS: None.   CONSULTATION: [facility].   PAST MEDICAL HISTORY:  Illnesses as above.  ALLERGIES: No known drug allergies.  PAST SURGICAL HISTORY:  Prostate surgery.  FAMILY HISTORY:  Unremarkable.  SOCIAL HISTORY:  The patient lives at home with his wife.  REVIEW OF SYSTEMS:  Unremarkable.  PHYSICAL EXAMINATION: The patient was alert and cooperative. The left leg was shortened and externally rotated. Vital signs were normal.   LABORATORY DATA: Laboratory data on admission was satisfactory.   HOSPITAL COURSE: The patient is an 81 year old male who fell going to the bathroom during the night on the date of admission. He was brought to the Emergency Room where exam and x-ray showed a displaced intertrochanteric fracture on the left. He was admitted for medical evaluation and surgery.  History as above. He was cleared by the medical service for surgery.  The patient was taken to surgery the day of injury and underwent a left compression hip nailing. Postoperatively he did well overall. His hemoglobin dropped to 7 and he was transfused two units of  packed cells. Hemoglobin was 8.3 on the day of discharge. He made gradual progress with physical therapy. His wound was benign. He is stable and ready for skilled nursing transfer today. Rehabilitation potential is good. He is to be partial weight-bearing on the left leg. We will see him in two weeks in the office for exam and x-ray.    ____________________________ Valinda HoarHoward E. Suan Pyeatt, MD hem:bjt D: 10/29/2011 14:12:58 ET T: 10/29/2011 14:48:02 ET JOB#: [id number]  cc: Valinda HoarHoward E. Corvette Orser, MD, <Dictator> Demetrios Isaacsonald E. Sherrie MustacheFisher, MD Valinda HoarHOWARD E Illya Gienger MD ELECTRONICALLY SIGNED 10/30/2011 14:30
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
## 2014-10-23 NOTE — Op Note (Signed)
PATIENT NAME:  [name], Travis Warren DATE OF BIRTH:  April 08, 1928  DATE OF PROCEDURE:  10/26/2011  PREOPERATIVE DIAGNOSIS: Displaced intertrochanteric fracture of the left hip.   POSTOPERATIVE DIAGNOSIS: Displaced intertrochanteric fracture of the left hip.   PROCEDURE PERFORMED: Open reduction and internal fixation of left hip with a Synthes DHS compression device (100 mm lag screw/145 degree 5-hole plate/compression screw).   SURGEON: Travis HoarHoward E. Makensie Mulhall, M.D.   ANESTHESIA: Spinal.   COMPLICATIONS: None.   DRAINS: Two Hemovacs.   ESTIMATED BLOOD LOSS: 150 mL.  REPLACEMENTS: None.   DESCRIPTION OF PROCEDURE: The patient was brought to the operating room where he underwent spinal anesthesia and was placed on the fracture table. The right leg was flexed and abducted. The left leg was reduced and placed in traction and internally rotated. Fluoroscopy showed good reduction of the fracture on AP and lateral views. The hip was prepped and draped in sterile fashion. A longitudinal incision was made along the proximal lateral femur and dissection carried out sharply through subcutaneous tissue and fascia. The muscle was divided and the femoral shaft exposed and retractors inserted. A 1/4 inch drill hole was made in the proximal lateral femur and a guidepin inserted under fluoroscopic control with good position on AP and lateral view. A depth gauge was used and was elected to use a 100 mm lag screw. The step cut reamer was used to enlarge the track in the femoral neck and head. A 100 mm lag screw was then inserted along with a 145 degree 5-hole plate. The plate was affixed to the shaft with five cortical screws measuring 40 and 42 mm in length. Traction was released and the fracture compressed. A compression screw was inserted and tightened. Fluoroscopy showed the fracture to be well reduced on AP and lateral views and hardware was in excellent position. The wound was irrigated. Hemovacs were  inserted and the fascia and muscle were closed with 0 Vicryl. The subcutaneous tissue was closed over the drain with 2-0 Vicryl. The skin was closed with staples. A dry sterile compression dressing was applied. 0.25% Marcaine with epinephrine was instilled. Hemovac was activated. The patient was transferred to his hospital bed and taken to recovery in good condition. He had good range of motion of the left hip without any crepitus or pain. ____________________________ Travis HoarHoward E. Eltha Tingley, MD hem:slb D: 10/26/2011 15:01:58 ET T: 10/26/2011 15:53:52 ET JOB#: [id number]  cc: Travis HoarHoward E. Trude Cansler, MD, <Dictator> Travis HoarHOWARD E Travis Linnen MD ELECTRONICALLY SIGNED 10/27/2011 17:02
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
## 2014-10-23 NOTE — Consult Note (Signed)
Chief Complaint:   Subjective/Chief Complaint Pt with hip fx. Hx of prostate CA, anemia, renal insufficiency. No cardiac hx. Denies CP. EKG and CXR OK.   Brief Assessment:   Cardiac Regular    Respiratory clear BS    Gastrointestinal details normal Soft  Nontender  Bowel sounds normal   Routine Chem:  27-Apr-13 06:25    Glucose, Serum 104   BUN 30   Creatinine (comp) 1.90   Sodium, Serum 130   Potassium, Serum 4.1   Chloride, Serum 97   CO2, Serum 25   Calcium (Total), Serum 8.4   eGFR (Non-African American) 32  Routine Hem:  27-Apr-13 06:25    WBC (CBC) 8.2   Hemoglobin (CBC) 10.6   Platelet Count (CBC) 254   Assessment/Plan:  Assessment/Plan:   Plan Pt medically stable and cleared for Ortho. surgery. Add peri-operative beta-blocker. F/u labs in AM. Will follow with you.   Electronic Signatures: Sparks, Jeffrey D (MD)  (Signed 27-Apr-13 08:53)  Authored: Chief Complaint, Brief Assessment, Lab Results, Assessment/Plan   Last Updated: 27-Apr-13 08:53 by Sparks, Jeffrey D (MD)
